# Patient Record
Sex: Female | Born: 1945
Health system: Southern US, Community
[De-identification: ages and names within clinical notes are randomized; demographics above are authoritative.]

## PROBLEM LIST (undated history)

## (undated) DIAGNOSIS — E119 Type 2 diabetes mellitus without complications: Secondary | ICD-10-CM

## (undated) DIAGNOSIS — J40 Bronchitis, not specified as acute or chronic: Secondary | ICD-10-CM

## (undated) DIAGNOSIS — I480 Paroxysmal atrial fibrillation: Secondary | ICD-10-CM

## (undated) DIAGNOSIS — E785 Hyperlipidemia, unspecified: Secondary | ICD-10-CM

## (undated) DIAGNOSIS — N189 Chronic kidney disease, unspecified: Secondary | ICD-10-CM

## (undated) DIAGNOSIS — M1990 Unspecified osteoarthritis, unspecified site: Secondary | ICD-10-CM

## (undated) DIAGNOSIS — K5900 Constipation, unspecified: Secondary | ICD-10-CM

## (undated) DIAGNOSIS — I10 Essential (primary) hypertension: Secondary | ICD-10-CM

## (undated) DIAGNOSIS — K219 Gastro-esophageal reflux disease without esophagitis: Secondary | ICD-10-CM

## (undated) DIAGNOSIS — D649 Anemia, unspecified: Secondary | ICD-10-CM

## (undated) HISTORY — PX: KNEE ARTHROSCOPY: SHX127

## (undated) HISTORY — DX: Chronic kidney disease, unspecified: N18.9

## (undated) HISTORY — DX: Type 2 diabetes mellitus without complications: E11.9

## (undated) HISTORY — DX: Anemia, unspecified: D64.9

## (undated) HISTORY — DX: Essential (primary) hypertension: I10

## (undated) HISTORY — PX: COLONOSCOPY: SHX174

## (undated) HISTORY — DX: Hyperlipidemia, unspecified: E78.5

## (undated) HISTORY — PX: TOTAL HIP ARTHROPLASTY: SHX124

## (undated) HISTORY — PX: LAPAROSCOPIC CHOLECYSTECTOMY: SUR755

---

## 2005-07-19 ENCOUNTER — Ambulatory Visit: Payer: Self-pay | Admitting: Cardiology

## 2006-03-05 ENCOUNTER — Encounter (INDEPENDENT_AMBULATORY_CARE_PROVIDER_SITE_OTHER): Payer: Self-pay | Admitting: *Deleted

## 2006-03-05 ENCOUNTER — Ambulatory Visit (HOSPITAL_BASED_OUTPATIENT_CLINIC_OR_DEPARTMENT_OTHER): Admission: RE | Admit: 2006-03-05 | Discharge: 2006-03-05 | Payer: Self-pay | Admitting: Orthopedic Surgery

## 2006-08-07 ENCOUNTER — Ambulatory Visit (HOSPITAL_COMMUNITY): Admission: RE | Admit: 2006-08-07 | Discharge: 2006-08-07 | Payer: Self-pay | Admitting: Family Medicine

## 2007-10-10 ENCOUNTER — Ambulatory Visit (HOSPITAL_COMMUNITY): Admission: RE | Admit: 2007-10-10 | Discharge: 2007-10-10 | Payer: Self-pay | Admitting: Family Medicine

## 2010-08-04 NOTE — Op Note (Signed)
NAME:  Kerri Brown, Kerri Brown             ACCOUNT NO.:  [id number]   MEDICAL RECORD NO.:  [id number]          PATIENT TYPE:  AMB   LOCATION:  [facility]                         FACILITY:  [facility]   PHYSICIAN:  Tarri Glenn, M.D.  DATE OF BIRTH:  06/06/1945   DATE OF PROCEDURE:  03/05/2006  DATE OF DISCHARGE:                               OPERATIVE REPORT   PREOPERATIVE DIAGNOSIS:  Degenerated/torn lateral meniscus with lateral  meniscus cyst, right knee.   POSTOPERATIVE DIAGNOSES:  1. Degenerated/torn lateral meniscus with lateral meniscus cyst, right      knee.  2. Torn medial meniscus.   OPERATION:  1. Right knee arthroscopy with partial medial and lateral      meniscectomies and debridement of medial femoral condyle and      patella.  2. Lateral arthrotomy, right knee with excision of lateral meniscus      cyst.   SURGEON:  Dr. Shellia Carwin   ASSISTANT:  Nurse.   ANESTHESIA:  General.   PATHOLOGY AND JUSTIFICATION FOR PROCEDURE:  She had had a right knee  arthroscopy elsewhere on October 17, 2005, and was found to have a  degenerative tear of the lateral meniscus.  There is also a degenerative  tear of the posterior horn of the medial meniscus; partial medial and  lateral meniscectomies were performed.  She has not done well since the  surgery with continued pain.  A followup MRI performed locally here on  February 23, 2006, has demonstrated degenerative arthritis of the lateral  joint and extensive intrameniscal cyst at the mid body of the anterior  horn of the lateral meniscus with secondary inflammatory changes.  Because of continued pain and the presence of the mass, she elected to  have further surgery on the right knee at this time.  See operative  description below for additional details.   PROCEDURE:  Satisfactory general anesthesia, pneumatic tourniquet.  Right leg was Esmarched out nonsterilely and thigh stabilizer applied  and the leg prepped from stabilizer to ankle with  DuraPrep and draped in  a sterile field.  Ace wrap and knee support for the left knee.  Superomedial saline inflow, first through an anterolateral portal of  medial compartment, knee joint was evaluated, and she had some grade 2-  3/4 wear of the medial femoral condyle which I pictured and smoothed  down.  She had a small recurrent tear of the posterior curve of the  medial meniscus which I pictured and shaved down until smooth with the  3.5 shaver.  Looking up in the medial gutter and suprapatellar area, she  had a little wear of the lateral facette of the patella which I pictured  and shaved.  I then reversed portals.  She had wear of the lateral  tibial plateau.  There was some disruption of the remaining lateral  meniscus in the midbody.  I shaved this down until smooth on probing.  There was no direct communication that I could see with the cyst  pressing the mass laterally, it did not change its size, and there was  no visualization of any problem in the joint.  Accordingly, I  temporarily halted the arthroscopic procedure and while the knee was in  a flexed position, made a lateral incision over the cyst, carrying it  down through the iliotibial band.  The cyst was quite inflamed and  thickened, and I excised it into the joint, as documented by  arthroscopic fluid coming forth.  The cyst measured about 2 cm in  diameter and was sent to pathology.  A picture was taken with the  arthroscopic equipment.  Bleeders were coagulated and this portion of  the wound closed with interrupted #1 Vicryl in the iliotibial band, 2-0  Vicryl in subcutaneous tissue, 4-0 nylon in the skin.  I then irrigated  the knee joint and removed all fluid possible, closing the 2 anterior  portals with 4-0 nylon.  I injected the lateral wound with 0.5% Marcaine  with adrenaline without morphine and then injected 20 mL of the Marcaine  with adrenaline with 4 mg morphine through the inflow apparatus which I   removed and closed this portal with 4-0 nylon as well.  Betadine,  Adaptic dry sterile dressing, knee immobilizer were applied.  She  tolerated the procedure well and was taken to the recovery room in  satisfactory condition with no known complications.           ______________________________  Tarri Glenn, M.D.     JA/MEDQ  D:  03/05/2006  T:  03/05/2006  Job:  [id number]

## 2010-08-05 ENCOUNTER — Inpatient Hospital Stay (HOSPITAL_COMMUNITY): Payer: Medicare Other

## 2010-08-05 ENCOUNTER — Inpatient Hospital Stay (HOSPITAL_COMMUNITY)
Admission: EM | Admit: 2010-08-05 | Discharge: 2010-08-08 | DRG: 470 | Disposition: A | Discharge status: Skilled Nursing Facility | Payer: Medicare Other | Source: Other Acute Inpatient Hospital | Attending: Orthopedic Surgery | Admitting: Orthopedic Surgery

## 2010-08-05 DIAGNOSIS — S72033A Displaced midcervical fracture of unspecified femur, initial encounter for closed fracture: Principal | ICD-10-CM | POA: Diagnosis present

## 2010-08-05 DIAGNOSIS — Y92009 Unspecified place in unspecified non-institutional (private) residence as the place of occurrence of the external cause: Secondary | ICD-10-CM

## 2010-08-05 DIAGNOSIS — M109 Gout, unspecified: Secondary | ICD-10-CM | POA: Diagnosis present

## 2010-08-05 DIAGNOSIS — E785 Hyperlipidemia, unspecified: Secondary | ICD-10-CM | POA: Diagnosis present

## 2010-08-05 DIAGNOSIS — W010XXA Fall on same level from slipping, tripping and stumbling without subsequent striking against object, initial encounter: Secondary | ICD-10-CM | POA: Diagnosis present

## 2010-08-05 DIAGNOSIS — Z7901 Long term (current) use of anticoagulants: Secondary | ICD-10-CM

## 2010-08-05 DIAGNOSIS — I10 Essential (primary) hypertension: Secondary | ICD-10-CM | POA: Diagnosis present

## 2010-08-05 DIAGNOSIS — F17200 Nicotine dependence, unspecified, uncomplicated: Secondary | ICD-10-CM | POA: Diagnosis present

## 2010-08-05 DIAGNOSIS — D62 Acute posthemorrhagic anemia: Secondary | ICD-10-CM | POA: Diagnosis not present

## 2010-08-05 DIAGNOSIS — E119 Type 2 diabetes mellitus without complications: Secondary | ICD-10-CM | POA: Diagnosis present

## 2010-08-05 LAB — BASIC METABOLIC PANEL
CO2: 26 mEq/L (ref 19–32)
Chloride: 103 mEq/L (ref 96–112)
GFR calc non Af Amer: 36 mL/min — ABNORMAL LOW (ref >60)
Glucose, Bld: 134 mg/dL — ABNORMAL HIGH (ref 70–99)
Potassium: 4.3 mEq/L (ref 3.5–5.1)
Sodium: 139 mEq/L (ref 135–145)

## 2010-08-05 LAB — MRSA PCR SCREENING: MRSA by PCR: NEGATIVE

## 2010-08-05 LAB — CBC
HCT: 37.3 % (ref 36.0–46.0)
Hemoglobin: 12.4 g/dL (ref 12.0–15.0)
RBC: 4.22 MIL/uL (ref 3.87–5.11)
WBC: 10.3 10*3/uL (ref 4.0–10.5)

## 2010-08-05 LAB — HEMOGLOBIN A1C
Hgb A1c MFr Bld: 6.1 % — ABNORMAL HIGH (ref <5.7)
Mean Plasma Glucose: 128 mg/dL — ABNORMAL HIGH (ref <117)

## 2010-08-05 LAB — URINALYSIS, ROUTINE W REFLEX MICROSCOPIC
Glucose, UA: NEGATIVE mg/dL
Leukocytes, UA: NEGATIVE
Protein, ur: 30 mg/dL — AB
Specific Gravity, Urine: 1.014 (ref 1.005–1.030)
Urobilinogen, UA: 0.2 mg/dL (ref 0.0–1.0)

## 2010-08-05 LAB — GLUCOSE, CAPILLARY: Glucose-Capillary: 136 mg/dL — ABNORMAL HIGH (ref 70–99)

## 2010-08-05 LAB — URINE MICROSCOPIC-ADD ON

## 2010-08-06 LAB — CBC
HCT: 26.5 % — ABNORMAL LOW (ref 36.0–46.0)
MCH: 30.9 pg (ref 26.0–34.0)
MCV: 88 fL (ref 78.0–100.0)
Platelets: 107 10*3/uL — ABNORMAL LOW (ref 150–400)
RBC: 3.01 MIL/uL — ABNORMAL LOW (ref 3.87–5.11)
RDW: 13.7 % (ref 11.5–15.5)
WBC: 10.7 10*3/uL — ABNORMAL HIGH (ref 4.0–10.5)

## 2010-08-06 LAB — URINE CULTURE
Colony Count: NO GROWTH
Culture: NO GROWTH

## 2010-08-06 LAB — GLUCOSE, CAPILLARY: Glucose-Capillary: 132 mg/dL — ABNORMAL HIGH (ref 70–99)

## 2010-08-06 LAB — BASIC METABOLIC PANEL
BUN: 16 mg/dL (ref 6–23)
Chloride: 103 mEq/L (ref 96–112)
Creatinine, Ser: 1.31 mg/dL — ABNORMAL HIGH (ref 0.4–1.2)
Glucose, Bld: 135 mg/dL — ABNORMAL HIGH (ref 70–99)
Potassium: 4.3 mEq/L (ref 3.5–5.1)

## 2010-08-06 LAB — PREPARE RBC (CROSSMATCH)

## 2010-08-07 LAB — BASIC METABOLIC PANEL
BUN: 13 mg/dL (ref 6–23)
CO2: 29 mEq/L (ref 19–32)
Chloride: 100 mEq/L (ref 96–112)
GFR calc non Af Amer: 40 mL/min — ABNORMAL LOW (ref >60)
Glucose, Bld: 97 mg/dL (ref 70–99)
Potassium: 3.9 mEq/L (ref 3.5–5.1)
Sodium: 135 mEq/L (ref 135–145)

## 2010-08-07 LAB — CBC
HCT: 24.7 % — ABNORMAL LOW (ref 36.0–46.0)
Hemoglobin: 8.3 g/dL — ABNORMAL LOW (ref 12.0–15.0)
MCH: 29.6 pg (ref 26.0–34.0)
MCHC: 33.6 g/dL (ref 30.0–36.0)
MCV: 88.2 fL (ref 78.0–100.0)
RDW: 13.5 % (ref 11.5–15.5)

## 2010-08-07 LAB — GLUCOSE, CAPILLARY: Glucose-Capillary: 123 mg/dL — ABNORMAL HIGH (ref 70–99)

## 2010-08-08 DIAGNOSIS — S72009A Fracture of unspecified part of neck of unspecified femur, initial encounter for closed fracture: Secondary | ICD-10-CM

## 2010-08-08 LAB — GLUCOSE, CAPILLARY: Glucose-Capillary: 139 mg/dL — ABNORMAL HIGH (ref 70–99)

## 2010-08-08 LAB — CBC
HCT: 24.3 % — ABNORMAL LOW (ref 36.0–46.0)
MCH: 29.9 pg (ref 26.0–34.0)
MCV: 87.4 fL (ref 78.0–100.0)
RDW: 13.4 % (ref 11.5–15.5)
WBC: 9.5 10*3/uL (ref 4.0–10.5)

## 2010-08-08 LAB — PROTIME-INR
INR: 1.41 (ref 0.00–1.49)
Prothrombin Time: 17.5 seconds — ABNORMAL HIGH (ref 11.6–15.2)

## 2010-08-09 LAB — TYPE AND SCREEN
Antibody Screen: NEGATIVE
Unit division: 0

## 2010-08-17 NOTE — Op Note (Signed)
NAME:  Kerri Brown, Kerri Brown             ACCOUNT NO.:  [id number]  MEDICAL RECORD NO.:  [id number]           PATIENT TYPE:  I  LOCATION:  5003                         FACILITY:  [facility]  PHYSICIAN:  Astrid Divine. Marcelino Scot, M.D. DATE OF BIRTH:  March 17, 1946  DATE OF PROCEDURE:  08/05/2010 DATE OF DISCHARGE:                              OPERATIVE REPORT   PREOPERATIVE DIAGNOSIS:  Displaced right femoral neck fracture.  POSTOPERATIVE DIAGNOSIS:  Displaced right femoral neck fracture.  PROCEDURE:  Right total hip arthroplasty with DePuy Gription cup and 2 screws 56 mm, #3 femoral stem, neutral neck and polyethylene liner.  SURGEON:  Astrid Divine. Marcelino Scot, MD  ASSISTANT:  Jari Pigg, PA  ANESTHESIA:  General.  COMPLICATIONS:  None.  ESTIMATED BLOOD LOSS:  260 mL.  DISPOSITION:  PACU.  CONDITION:  Stable.  BRIEF SUMMARY AND INDICATION FOR PROCEDURE:  Kerri Brown is a 65 year old female with antecedent right groin pain and arthritic symptoms, who fell and sustained a displaced femoral neck fracture.  I discussed with her preoperatively the risks and benefits of surgery including the possibility of infection, nerve injury, vessel injury, heart attack, stroke, leg length inequality, hip instability, and multiple others. She did wish to proceed.  Furthermore, we specifically discussed hemiarthroplasty versus total hip arthroplasty versus ORIF, explaining the risk of AVN, instability, blood loss; a subsequent surgery which could include conversion to total hip arthroplasty, revision arthroplasty, and multiple others; and she did wish to proceed with the recommended treatment of total hip arthroplasty.  BRIEF SUMMARY OF PROCEDURE:  Kerri Brown was administered preop antibiotics, taken to the operating room where general anesthesia was induced.  She was positioned right side up on a beanbag.  A standard prep and drape was then performed.  An anterolateral approach was made through a straight  incision centered over the proximal aspect of the greater trochanter.  The tensor was split in line with the incision. The anterior third of the medius was identified and divided. Electrocautery was used to release the musculature and hip capsule off the anterior aspect of the femur as the leg was gently rotated and the hip extended.  The femoral neck fracture was exposed and the cutting guide used to establish the appropriate plane.  This was followed with placement of Homan to protect the underlying bone and soft tissues.  The neck cut was made and then the acetabulum exposed by placing anterior and posterior retractors.  A long knife was used to debride the labrum and the articular cartilage followed by sequential reaming.  Reaming was begun in the medial inferior corner down to the fovea and then expanded out.  At 52 mm, there appeared to be some deep cancellus bone; however, the rim fit did not seem adequate.  Consequently reamed up additional size.  The 56-mm cup was then seated and initially appeared to have stability; however, in checking the hip, it appeared to become unstable and subsequent palpation did identify a 4-6-mm area along the posterior wall that no longer provided a good rim resistance.  Consequently, a decision was made to convert to a cup with screw augmentation.  A  40-mm cancellus screw was placed superiorly and a 25-mm screw posteriorly. This resulted in excellent cup stability and then the femur was prepared in standard fashion with sequential reaming and broaching up to 3 which gave an outstanding fit and control.  A standard neck and head produced outstanding stability in full extension and external rotation as well as flexion, abduction, internal rotation.  The hip appeared to extend with appropriate resistance and just very minimal shuck with complete pharmacologic paralysis.  The wound was copiously irrigated and then the actual implants placed.  I did also  use a C-arm to check position of the screws and confirm placement of the cup.  Standard layered closure was performed using bone tunnels for the anterior capsule and abductor attachments with a #1 Vicryl and then after the deep subcu, 0 Vicryl and 2-0 Vicryl for the subcu and staples for the skin.  Sterile gently compressive dressing was applied.  The patient was then awakened from anesthesia and transported to the PACU in stable condition.  Ainsley Spinner, PA-C assisted me throughout the procedure and was absolutely necessary for safe and effective completion of the case.  He was required to retract for acetabular exposure, control of the femur for preparation of the femoral component, and also assist with dislocation/relocation of the trial and actual components.  PROGNOSIS:  Kerri Brown will be touchdown or partial weightbearing up to 50% with PT.  She will have anterior hip precautions and will progress with PT and OT.  She will be on Coumadin for DVT prophylaxis as well as pharmacologic prophylaxis and bridging with Lovenox.     Astrid Divine. Marcelino Scot, M.D.     MHH/MEDQ  D:  08/05/2010  T:  08/06/2010  Job:  [id number]  Electronically Signed by Altamese El Refugio M.D. on 08/17/2010 12:00:18 AM

## 2010-08-17 NOTE — H&P (Signed)
Kerri Brown, Brown             ACCOUNT NO.:  [id number]  MEDICAL RECORD NO.:  [id number]           PATIENT TYPE:  LOCATION:                                 FACILITY:  PHYSICIAN:  Astrid Divine. Marcelino Scot, M.D. DATE OF BIRTH:  1945/10/13  DATE OF ADMISSION:  08/04/2010 DATE OF DISCHARGE:                             HISTORY & PHYSICAL   CHIEF COMPLAINT:  Fall with right hip pain.  PRIMARY CARE PHYSICIAN:  Dr. Matthias Hughs.  BRIEF HISTORY OF PRESENT ILLNESS:  Kerri Brown is a 65 year old Caucasian female who was playing with her new puppy yesterday afternoon when she got tripped up and fell.  The patient landed on her right hip, had immediate onset of pain and inability to bear weight.  She was brought to [facility] for evaluation and workup which demonstrated displaced right femoral neck fracture secondary to orthopedic surgery unavailability, the patient was transported to [facility] for definitive management.  The patient denies any chest pain, shortness of breath.  No nausea, vomiting, no recent illnesses. No lightheadedness, no visual changes, no headaches.  Currently, Kerri Brown is in room 5003, complains of right hip pain.  Denies injuries elsewhere.  Does not note any shortness of breath.  Again no chest pain, palpitations are noted at current time.  PAST MEDICAL HISTORY:  Hypertension, insulin-dependent diabetes mellitus, hyperlipidemia, and gout.  PAST SURGICAL HISTORY:  Right knee arthroscopy.  FAMILY HISTORY:  Noncontributory.  SOCIAL HISTORY:  The patient smokes 1/2 to 1-pack per day, has done so since 1975.  Denies alcohol use.  She lives in [place].  ALLERGIES:  No known drug allergies.  MEDICATIONS PRIOR TO ADMISSION:  Lopressor, Lantus, NovoLog, allopurinol, pravastatin.  REVIEW OF SYSTEMS:  Notable for right hip pain.  PHYSICAL EXAMINATION:  VITAL SIGNS:  Temperature 97.7, heart rate 78, respirations 18 at 98% on room air, BP is  137/77. GENERAL:  The patient is an old appearing female but in no acute distress. NECK:  Supple.  No lymphadenopathy.  No tenderness. LUNGS:  Decreased at bases but otherwise clear. CARDIAC:  S1 and S2 are noted. ABDOMEN:  Soft, nontender with positive bowel sounds.  The patient has a Foley in. EXTREMITIES:  Bilateral upper extremity and left lower extremity without any acute findings.  Motor and sensory function are intact.  Full active motion is noted.  Extremities are warm.  Right lower extremity:  The patient has tenderness to palpation over the right hip with any motion. No knee effusion or pain with palpation of her right knee is noted. Ankle motion is intact as well.  No deep calf tenderness noted. Compartments are soft and nontender.  Distal motor and sensory functions are intact along the deep peroneal nerve, superficial peroneal nerve, and tibial nerve.  EHL, FHL, anterior tibialis, posterior tibialis, peroneals, gastroc-soleus complex motor function are intact.  Quadriceps and hamstring motor function are intact as well.  Palpable dorsalis pedis pulses noted.  X-RAYS:  AP pelvis and lateral hip demonstrates a displaced transcervical femoral neck fracture with rotation of the femoral head, 100% displacement.  There is also preexisting degenerative changes to the hips  bilaterally.  EKG demonstrates right bundle-branch block as well as left anterior fascicular bundle-branch block.  Chest x-ray is notable for chronic changes likely related to COPD.  No acute processes are noted.  LABORATORY DATA:  Sodium 139, potassium 4.3, chloride 103, bicarb 26, BUN 21, creatinine 1.46, glucose 134, white blood cells 10.3, hemoglobin 12.4, hematocrit 37.3, platelets 150,000.  PLAN:  A 65 year old female status post fall with displaced right femoral neck fracture. 1. Displaced right femoral neck fracture, OTA classification 31-B3     [place] 4.  Given her preexisting hip arthritis as well  as her young     age, the patient is a candidate for total hip replacement.  We plan     on proceeding with this today.  The patient will likely be     weightbearing as tolerated after surgery.  I would anticipate a     short stay.  When stable, she would be discharged to home or short-     term nursing facility.  The patient will also have posterior     precautions as well. 2. Hypertension.  Resume home medications. 3. Insulin-dependent diabetes mellitus.  We will start sliding scale     insulin after surgery as well as resume her Lantus.  We will     continue close monitoring of her blood sugars while she is     inpatient to ensure optimal healing. 4. Hyperlipidemia and gout.  Continue home medications, DVT, PE     prophylaxis.  We will begin Lovenox after surgery and will continue     for 14 days afterwards, SCDs as well for mechanical prophylaxis.     EKG changes, the patient is asymptomatic at current time.  The     patient should follow up with her primary care physician regarding     any additional further workup from this.  ACTIVITY:  Bedrest for now.  We will obtain PT/OT consult postoperatively.  Continue to monitor the patient very closely as well.  DISPOSITION:  OR today for total hip arthroplasty with continued pain control and observation after surgery.     Jari Pigg, PA   ______________________________ Astrid Divine. Marcelino Scot, M.D.    KWP/MEDQ  D:  08/05/2010  T:  08/05/2010  Job:  [id number]  Electronically Signed by Ainsley Spinner PA on 08/07/2010 01:04:54 PM Electronically Signed by Altamese Rossmoyne M.D. on 08/17/2010 12:00:15 AM

## 2010-08-17 NOTE — Discharge Summary (Signed)
NAME:  Kerri Brown, Kerri Brown             ACCOUNT NO.:  [id number]  MEDICAL RECORD NO.:  [id number]           PATIENT TYPE:  I  LOCATION:  5003                         FACILITY:  [facility]  PHYSICIAN:  Astrid Divine. Marcelino Scot, M.D. DATE OF BIRTH:  11-11-1945  DATE OF ADMISSION:  08/05/2010 DATE OF DISCHARGE:  08/08/2010                        DISCHARGE SUMMARY - REFERRING   DISCHARGE DIAGNOSES: 1. Fall. 2. Displaced right femoral neck fracture. 3. Acute blood loss anemia, stable and asymptomatic.  ADDITIONAL DISCHARGE DIAGNOSES: 1. Hypertension. 2. Insulin-dependent diabetes mellitus. 3. Hyperlipidemia. 4. Gout.  PROCEDURES PERFORMED:  On Aug 05, 2010, right total hip arthroplasty.  BRIEF HISTORY AND HOSPITAL COURSE:  Kerri Brown is a 65 year old Caucasian female who was playing with her new puppy on Aug 03, 2010, when she got tripped up.  She fell and had immediate onset of pain, inability to bear weight.  She was subsequently brought to [facility] for evaluation and treatment of her fracture.  However, they were unable to treat her and she was then transported to [facility] Brown to our service.  The patient was seen and evaluated shortly after arrival.  Based on her findings and clinical exam as well as past history of groin pain on the right side, we decided to proceed with a total hip arthroplasty for this patient.  Kerri Brown hospital stay was relatively uncomplicated.  Postoperative day #1, she was doing very well.  She did complain of some right hip pain but overall was doing fantastic.  Physical exam was unremarkable.  Labs showed a hemoglobin of 9.3  and hematocrit of 26.5.  She began to progress very well with physical therapy as well and did not have any have significant issues. On postop day #2, the patient continued to be very well, tolerating her diet.  Again her physical exam and vital signs and labs all remained stable.  She was started on Lovenox and Coumadin  for DVT prophylaxis. Her H and H did take another drop to 8.3 and 24.7; however, she was quite asymptomatic as well.  The patient was also continued on sliding scale insulin for additional coverage for her diabetes which do appear to be fairly well controlled as she had a hemoglobin A1c 6.1.  On postoperative day #3, the patient was deemed stable both from orthopedic standpoint as well as physical therapy standpoint to be discharged to a skilled nursing facility.  Clinical encounter note for postoperative day #3 is as follows, subjective/objective, the patient is doing great.  Pain is well controlled.  PHYSICAL EXAMINATION:  VITAL SIGNS:  Temperature 98.2, heart rate 72, respirations 20 at 100% on room air, BP is 110/53.  INR 1.41 and follow- up labs, CBC, hemoglobin 8.3, hematocrit 24.3, platelets 143, white blood cells 9.5. GENERAL:  The patient in no acute distress.  She is eating breakfast. LUNGS:  Clear bilaterally. CARDIAC:  S1 and S2 noted. ABDOMEN:  Soft, nontender with positive bowel sounds. EXTREMITIES:  Right lower extremity incision looks fantastic, edges are everted nicely.  No signs of infection.  No redness, no purulent drainage.  EHL, FHL, anterior tibialis, posterior tibialis, peroneals, gastroc-soleus complex, motor function  intact.  Extremities are warm. Palpable dorsalis pedis pulses noted.  No pain with passive stretch.  No new calf tenderness noted as well.  ASSESSMENT AND PLAN:  A 65 year old female, status post fall with right femoral neck fracture. 1. Right femoral neck fracture, status post right total hip     arthroplasty.  The patient will be partial weightbearing 50% on her     right leg.  She will have anterior hip precautions.  She will     continue to work with PT and OT on a daily basis. 2. Acute blood loss anemia, asymptomatic. 3. Insulin-dependent diabetes mellitus, excellent control.  Continue     current medication regimen. 4. Hypertension,  controlled.  Continue home meds. 5. Hyperlipidemia and gout.  Continue home meds. 6. Pain.  Continue oral pain medications which include Percocet as     well as breakthrough OxyIR. 7. DVT and PE prophylaxis with Lovenox and Coumadin.  Continue lower     coverage until INR is therapeutic which is between 2 and 3, then     can discontinue Lovenox.  Continue Coumadin for 4 weeks.  DISPOSITION:  We will discharge to a skilled nursing facility today.  DISCHARGE MEDICATIONS: 1. Colace 100 mg 1 p.o. b.i.d. 2. Lovenox 40 mg 1 subcu injection daily, discontinue when INR is     therapeutic, on Coumadin with a level between 2 and 3. 3. Insulin glargine 24 units subcu at bedtime daily. 4. Robaxin 500 mg 1 p.o. every 6 hours as needed. 5. Oxycodone 5 mg immediate release 1-2 p.o. every 3 hours as needed     for breakthrough pain. 6. Percocet 5/325 one to two p.o. every 6 hours as needed for pain. 7. Maalox 17 g by mouth daily. 8. Coumadin per Pharmacy protocol daily. 9. Allopurinol 190 mg one p.o. daily. 10.Glipizide 5 mg one p.o. daily. 11.Metoprolol 25 mg one p.o. b.i.d. 12.NovoLog per sliding scale. 13.Pravastatin 40 mg one p.o. daily.  DISCHARGE INSTRUCTIONS AND PLAN:  Kerri Brown did sustain a fairly significant injury to her right hip.  However, we were able to achieve excellent fixation utilizing total hip arthroplasty.  Given her pre- existing hip arthritis, this is correct decision and will likely afford the patient's best opportunity to full recovery.  Kerri Brown will remain partial weightbearing with 50% of her body weight on her right leg for the next 6 weeks.  She will also have anterior hip precautions for life as well.  With respect to wound care, her incision should be checked daily, dry dressing can be applied to it if still draining, otherwise may be left open to air.  Once it is completely dry, can begin to clean with soap and water.  At no point, should any  lotions, ointments, or solutions be applied to the wound as these can cause dehiscence.  Kerri Brown will also remain on DVT prophylaxis for the next 4-6 weeks as well.  We will use Lovenox bridged to Coumadin to accomplish this.  The patient should participate with physical therapy, occupational therapy on a daily basis as well and can be discharged home when she is deemed safe enough to do so . The patient is discharged on a regular diet as well.  She will follow up with the orthopedic office in 10-14 days for reevaluation, follow-up x- rays, and staple removal.  Should the nursing home have any questions prior to her follow-up visit, they are encouraged to contact the office at [phone].  Jari Pigg, PA   ______________________________ Astrid Divine. Marcelino Scot, M.D.    KWP/MEDQ  D:  08/08/2010  T:  08/08/2010  Job:  [id number]  Electronically Signed by Ainsley Spinner PA on 08/16/2010 02:46:27 PM Electronically Signed by Altamese Midway M.D. on 08/17/2010 12:00:23 AM

## 2015-03-30 ENCOUNTER — Encounter: Payer: Self-pay | Admitting: Cardiovascular Disease

## 2015-04-12 ENCOUNTER — Other Ambulatory Visit: Payer: Self-pay | Admitting: *Deleted

## 2015-04-12 ENCOUNTER — Ambulatory Visit (INDEPENDENT_AMBULATORY_CARE_PROVIDER_SITE_OTHER): Payer: Medicare Other | Admitting: Cardiovascular Disease

## 2015-04-12 ENCOUNTER — Encounter: Payer: Self-pay | Admitting: *Deleted

## 2015-04-12 VITALS — BP 128/77 | HR 69 | Ht 67.0 in | Wt 150.0 lb

## 2015-04-12 DIAGNOSIS — R002 Palpitations: Secondary | ICD-10-CM | POA: Diagnosis not present

## 2015-04-12 DIAGNOSIS — E785 Hyperlipidemia, unspecified: Secondary | ICD-10-CM

## 2015-04-12 DIAGNOSIS — I10 Essential (primary) hypertension: Secondary | ICD-10-CM

## 2015-04-12 NOTE — Progress Notes (Signed)
Patient ID: Kerri Brown, female   DOB: Jan 12, 1946, 70 y.o.   MRN: [id number]       CARDIOLOGY CONSULT NOTE  Patient ID: Kerri Brown MRN: [id number] DOB/AGE: [birth year] 70 y.o.  Admit date: (Not on file) Primary Physician Deloria Lair, MD  Requesting physician: Lowanda Foster  Reason for Consultation: arrhythmia  HPI:  The patient is a 70 year old female with stage IV chronic kidney disease, insulin-dependent diabetes mellitus, hypertension , gout, and hyperlipidemia  who is referred for arrhythmia.  ECG performed in the office today demonstrates sinus rhythm, heart rate 67 bpm, with right bundle-branch block and left anterior fascicular block.  Approximately one year ago, she was experiencing rapid palpitations occasionally waking her from sleep, sometimes as frequently as twice per week. She was then started on metoprolol 25 mg twice daily and she now experiences palpitations roughly once a month. She used to have chest pains but this was seldom. She denies exertional dyspnea. She also denies leg swelling, orthopnea, and paroxysmal nocturnal dyspnea. She said she feels very well now.  She did say that her nephrologist appreciated and arrhythmia at a recent visit and was warned she may have a stroke.    No Known Allergies  Current Outpatient Prescriptions  Medication Sig Dispense Refill  . allopurinol (ZYLOPRIM) 100 MG tablet Take 1 tablet by mouth daily.    Marland Kitchen aspirin EC 81 MG tablet Take 81 mg by mouth 2 (two) times a week.    . benazepril (LOTENSIN) 20 MG tablet Take 1 tablet by mouth daily.    . cholecalciferol (VITAMIN D) 1000 units tablet Take 1,000 Units by mouth daily.    Marland Kitchen LANTUS SOLOSTAR 100 UNIT/ML Solostar Pen Inject 42 Units into the skin daily.    . metoprolol tartrate (LOPRESSOR) 25 MG tablet Take 1 tablet by mouth 2 (two) times daily.    . pravastatin (PRAVACHOL) 40 MG tablet Take 1 tablet by mouth daily.     No current facility-administered medications for  this visit.    Past Medical History  Diagnosis Date  . Diabetes ([place])     stage 4  . CKD (chronic kidney disease)   . Hypertension   . Anemia   . Hyperlipemia     Past Surgical History  Procedure Laterality Date  . Laparoscopic cholecystectomy    . Total hip arthroplasty Right   . Knee arthroscopy Right     Social History   Social History  . Marital Status: Widowed    Spouse Name: N/A  . Number of Children: N/A  . Years of Education: N/A   Occupational History  . Not on file.   Social History Main Topics  . Smoking status: Not on file  . Smokeless tobacco: Not on file  . Alcohol Use: Not on file  . Drug Use: Not on file  . Sexual Activity: Not on file   Other Topics Concern  . Not on file   Social History Narrative  . No narrative on file     No family history of premature CAD in 1st degree relatives.  Prior to Admission medications   Medication Sig Start Date End Date Taking? Authorizing Provider  allopurinol (ZYLOPRIM) 100 MG tablet Take 1 tablet by mouth daily. 03/01/15   Historical Provider, MD  benazepril (LOTENSIN) 20 MG tablet Take 1 tablet by mouth daily. 02/02/15   Historical Provider, MD  cholecalciferol (VITAMIN D) 1000 units tablet Take 1,000 Units by mouth daily.    Historical Provider, MD  iron polysaccharides (NIFEREX) 150 MG capsule Take 150 mg by mouth daily.    Historical Provider, MD  LANTUS SOLOSTAR 100 UNIT/ML Solostar Pen Inject 42 Units into the skin daily. 04/05/15   Historical Provider, MD  metoprolol tartrate (LOPRESSOR) 25 MG tablet Take 1 tablet by mouth 2 (two) times daily. 04/05/15   Historical Provider, MD  pravastatin (PRAVACHOL) 40 MG tablet Take 1 tablet by mouth daily. 03/01/15   Historical Provider, MD     Review of systems complete and found to be negative unless listed above in HPI     Physical exam Blood pressure 128/77, pulse 69, height 5\' 7"  (1.702 m), weight 150 lb (68.04 kg). General: NAD Neck: No JVD, no  thyromegaly or thyroid nodule.  Lungs: Clear to auscultation bilaterally with normal respiratory effort. CV: Nondisplaced PMI. Regular rate and mostly regular rhythm with some variation, normal S1/S2, no [id number], soft 1/6 systolic murmur along left sternal border.  No peripheral edema.  Abdomen: Soft, nontender, no hepatosplenomegaly, no distention.  Skin: Intact without lesions or rashes.  Neurologic: Alert and oriented x 3.  Psych: Normal affect. Extremities: No clubbing or cyanosis.  HEENT: Normal.   ECG: Most recent ECG reviewed.  Labs:   Lab Results  Component Value Date   WBC 9.5 08/08/2010   HGB 8.3* 08/08/2010   HCT 24.3* 08/08/2010   MCV 87.4 08/08/2010   PLT 143* 08/08/2010   No results for input(s): NA, K, CL, CO2, BUN, CREATININE, CALCIUM, PROT, BILITOT, ALKPHOS, ALT, AST, GLUCOSE in the last 168 hours.  Invalid input(s): LABALBU No results found for: CKTOTAL, CKMB, CKMBINDEX, TROPONINI No results found for: CHOL No results found for: HDL No results found for: LDLCALC No results found for: TRIG No results found for: CHOLHDL No results found for: LDLDIRECT       Studies: No results found.  ASSESSMENT AND PLAN:  1. Palpitations: Given her CKD and hypertension, there is some concern for atrial fibrillation which would require anticoagulation. I will have her hold her metoprolol for three weeks and have her wear an event monitor to see if we can capture any worrisome arrhythmias. Afterwards, she can resume metoprolol.  2. Essential HTN: Controlled on benazepril. No changes.  3. Hyperlipidemia: Continue pravastatin.  Dispo: f/u 2 months.   Signed: Kate Sable, M.D., F.A.C.C.  04/12/2015, 10:18 AM

## 2015-04-12 NOTE — Patient Instructions (Signed)
Your physician has recommended that you wear a 3 week event monitor. Event monitors are medical devices that record the heart's electrical activity. Doctors most often [place] these monitors to diagnose arrhythmias. Arrhythmias are problems with the speed or rhythm of the heartbeat. The monitor is a small, portable device. You can wear one while you do your normal daily activities. This is usually used to diagnose what is causing palpitations/syncope (passing out). Please hold your Metoprolol during the time you are wearing the monitor.  Office will contact with results via phone or letter.   Continue all current medications. Follow up in  2 months.

## 2015-04-15 ENCOUNTER — Ambulatory Visit (INDEPENDENT_AMBULATORY_CARE_PROVIDER_SITE_OTHER): Payer: Medicare Other

## 2015-04-15 DIAGNOSIS — R002 Palpitations: Secondary | ICD-10-CM | POA: Diagnosis not present

## 2015-05-16 ENCOUNTER — Telehealth: Payer: Self-pay | Admitting: *Deleted

## 2015-05-16 DIAGNOSIS — I4891 Unspecified atrial fibrillation: Secondary | ICD-10-CM

## 2015-05-19 MED ORDER — RIVAROXABAN 20 MG PO TABS: 20.0000 mg | ORAL_TABLET | Freq: Every day | ORAL | Status: DC

## 2015-05-19 NOTE — Telephone Encounter (Signed)
Notes Recorded by Laurine Blazer, LPN on [id number] at 075-GRM AM Patient notified. Agrees to do Echo & begin Xarelto. Will provide samples & printed scripts for patient to pick up today. Follow up scheduled for 06/10/2015 with Dr. Bronson Ing.

## 2015-05-19 NOTE — Telephone Encounter (Signed)
Notes Recorded by Laurine Blazer, LPN on [id number] at D34-534 AM Left message to return call.  Notes Recorded by Laurine Blazer, LPN on [id number] at 075-GRM PM Busy.   Notes Recorded by Herminio Commons, MD on 05/12/2015 at 4:11 PM Needs echocardiogram and anticoagulation. Would start Xarelto 20 mg daily.

## 2015-05-26 ENCOUNTER — Other Ambulatory Visit: Payer: Self-pay

## 2015-05-26 ENCOUNTER — Ambulatory Visit (INDEPENDENT_AMBULATORY_CARE_PROVIDER_SITE_OTHER): Payer: Medicare Other

## 2015-05-26 DIAGNOSIS — I4891 Unspecified atrial fibrillation: Secondary | ICD-10-CM

## 2015-05-26 LAB — ECHOCARDIOGRAM COMPLETE
CHL CUP STROKE VOLUME: 64 mL
E decel time: 351 msec
FS: 35 % (ref 28–44)
IVS/LV PW RATIO, ED: 0.85
LA diam end sys: 34 cm
LDCA: 2.01 cm2
LV PW d: 12.3 mm — AB (ref 0.6–1.1)
LVOT peak grad rest: 9 mmHg
LVOTPV: 146 m/s
MV Peak grad: 2 mmHg
MVPKAVEL: 71 m/s
MVPKEVEL: 75.9 m/s
VTI: 31.7 cm

## 2015-06-02 ENCOUNTER — Telehealth: Payer: Self-pay | Admitting: *Deleted

## 2015-06-02 NOTE — Telephone Encounter (Signed)
Patient advised that her echo has not been reviewed by the doctor yet but that she did have a normal pumping function. Patient advised that once the doctor results on her echo, that she would be contacted with the official result.

## 2015-06-10 ENCOUNTER — Encounter: Payer: Self-pay | Admitting: Cardiovascular Disease

## 2015-06-10 ENCOUNTER — Ambulatory Visit (INDEPENDENT_AMBULATORY_CARE_PROVIDER_SITE_OTHER): Payer: Medicare Other | Admitting: Cardiovascular Disease

## 2015-06-10 VITALS — BP 120/72 | HR 60 | Ht 67.0 in | Wt 153.0 lb

## 2015-06-10 DIAGNOSIS — Z7189 Other specified counseling: Secondary | ICD-10-CM

## 2015-06-10 DIAGNOSIS — I480 Paroxysmal atrial fibrillation: Secondary | ICD-10-CM | POA: Diagnosis not present

## 2015-06-10 DIAGNOSIS — I10 Essential (primary) hypertension: Secondary | ICD-10-CM

## 2015-06-10 DIAGNOSIS — E785 Hyperlipidemia, unspecified: Secondary | ICD-10-CM

## 2015-06-10 NOTE — Progress Notes (Signed)
Patient ID: Kerri Brown, female   DOB: 1945/04/16, 70 y.o.   MRN: [id number]      SUBJECTIVE: The patient returns for follow-up after undergoing cardiovascular testing performed for the evaluation of palpitations. Event monitoring demonstrated paroxysmal rapid atrial fibrillation which was asymptomatic. Echocardiography demonstrated normal left ventricular systolic function, EF 123456, mild LVH, grade 2 diastolic dysfunction, and mild left atrial enlargement. I started her on Xarelto 20 mg daily. She is already on metoprolol. She seldom has palpitations. She denies bleeding problems.   Review of Systems: As per "subjective", otherwise negative.  No Known Allergies  Current Outpatient Prescriptions  Medication Sig Dispense Refill  . allopurinol (ZYLOPRIM) 100 MG tablet Take 1 tablet by mouth daily.    . benazepril (LOTENSIN) 20 MG tablet Take 1 tablet by mouth daily.    . cholecalciferol (VITAMIN D) 1000 units tablet Take 1,000 Units by mouth daily.    Marland Kitchen LANTUS SOLOSTAR 100 UNIT/ML Solostar Pen Inject 42 Units into the skin daily.    . metoprolol tartrate (LOPRESSOR) 25 MG tablet Take 1 tablet by mouth 2 (two) times daily.    . pravastatin (PRAVACHOL) 40 MG tablet Take 1 tablet by mouth daily.    . rivaroxaban (XARELTO) 20 MG TABS tablet Take 1 tablet (20 mg total) by mouth daily with supper. 30 tablet 6   No current facility-administered medications for this visit.    Past Medical History  Diagnosis Date  . Diabetes ([place])     stage 4  . CKD (chronic kidney disease)   . Hypertension   . Anemia   . Hyperlipemia     Past Surgical History  Procedure Laterality Date  . Laparoscopic cholecystectomy    . Total hip arthroplasty Right   . Knee arthroscopy Right     Social History   Social History  . Marital Status: Widowed    Spouse Name: N/A  . Number of Children: N/A  . Years of Education: N/A   Occupational History  . Not on file.   Social History Main Topics  .  Smoking status: Former Smoker -- 0.50 packs/day for 44 years    Types: Cigarettes    Start date: 05/04/1970    Quit date: 05/04/2014  . Smokeless tobacco: Never Used  . Alcohol Use: Not on file  . Drug Use: Not on file  . Sexual Activity: Not on file   Other Topics Concern  . Not on file   Social History Narrative     Filed Vitals:   06/10/15 1032  BP: 120/72  Pulse: 60  Height: 5\' 7"  (1.702 m)  Weight: 153 lb (69.4 kg)    PHYSICAL EXAM General: NAD Neck: No JVD, no thyromegaly or thyroid nodule.  Lungs: Clear to auscultation bilaterally with normal respiratory effort. CV: Nondisplaced PMI. Regular rate and rhythm, normal S1/S2, no [id number], soft 1/6 systolic murmur along left sternal border. No peripheral edema.  Abdomen: Soft, nontender, no hepatosplenomegaly, no distention.  Skin: Intact without lesions or rashes.  Neurologic: Alert and oriented x 3.  Psych: Normal affect. Extremities: No clubbing or cyanosis.  HEENT: Normal.   ECG: Most recent ECG reviewed.      ASSESSMENT AND PLAN: 1. Paroxsymal atrial fibrillation: Symptomatically stable on metoprolol. Continue Xarelto for thromboembolic risk reduction given CHA2DS2Vasc score of 4.  2. Essential HTN: Controlled on benazepril. No changes.  3. Hyperlipidemia: Continue pravastatin.  Dispo: f/u 6 months.   Kate Sable, M.D., F.A.C.C.

## 2015-06-10 NOTE — Patient Instructions (Signed)
Continue all current medications. Your physician wants you to follow up in: 6 months.  You will receive a reminder letter in the mail one-two months in advance.  If you don't receive a letter, please call our office to schedule the follow up appointment   

## 2015-06-21 ENCOUNTER — Ambulatory Visit (INDEPENDENT_AMBULATORY_CARE_PROVIDER_SITE_OTHER): Payer: Medicare Other | Admitting: *Deleted

## 2015-06-21 DIAGNOSIS — Z5181 Encounter for therapeutic drug level monitoring: Secondary | ICD-10-CM

## 2015-06-21 DIAGNOSIS — I4891 Unspecified atrial fibrillation: Secondary | ICD-10-CM | POA: Diagnosis not present

## 2015-06-21 MED ORDER — RIVAROXABAN 15 MG PO TABS: 15.0000 mg | ORAL_TABLET | Freq: Every day | ORAL | Status: DC

## 2015-06-21 NOTE — Progress Notes (Signed)
Pt was started on Xarelto 20mg  daily for atrial fibrillation on 05/19/15 by Dr Bronson Ing.  Labs: 03/01/15  SrCr 1.86  CrCl 30.66  Wt 152  Hgb 12.7  Hct 39.7  Pt denies any problems taking Xarelto.  Denies excessive bruising, bleeding or GI Upset.  Reviewed patients medication list.  Pt is not currently on any combined P-gp and strong CYP3A4 inhibitors/inducers (ketoconazole, traconazole, ritonavir, carbamazepine, phenytoin, rifampin, St. John's wort).  Reviewed labs on 06/08/15 from Dr Rhona Leavens office.  SCr 1.92, Weight 153, CrCl 29.87.  Dose needs to be decreased to 15mg  daily based on CrCl.   Hgb and HCT: 12.4/39.1  A full discussion of the nature of anticoagulants has been carried out.  A benefit/risk analysis has been presented to the patient, so that they understand the justification for choosing anticoagulation with Xarelto at this time.  The need for compliance is stressed.  Pt is aware to take the medication once daily with the largest meal of the day.  Side effects of potential bleeding are discussed, including unusual colored urine or stools, coughing up blood or coffee ground emesis, nose bleeds or serious fall or head trauma.  Discussed signs and symptoms of stroke. The patient should avoid any OTC items containing aspirin or ibuprofen.  Avoid alcohol consumption.   Call if any signs of abnormal bleeding.  Discussed financial obligations and resolved any difficulty in obtaining medication.    Discussed dose change with Dr Bronson Ing and he agreed.  New Rx sent to [facility].  Next lab test test in 3 months.  Appt made for 09/27/15

## 2015-08-12 ENCOUNTER — Other Ambulatory Visit: Payer: Self-pay | Admitting: *Deleted

## 2015-08-12 MED ORDER — RIVAROXABAN 15 MG PO TABS: 15.0000 mg | ORAL_TABLET | Freq: Every day | ORAL | Status: DC

## 2015-08-12 NOTE — Telephone Encounter (Signed)
Patient's insurance requires prior authorization for xarelto 15 mg. Patient said she will be out on Monday. Samples provided for patient pick up at the [place] office. Patient aware.

## 2015-08-17 ENCOUNTER — Telehealth: Payer: Self-pay | Admitting: *Deleted

## 2015-08-17 NOTE — Telephone Encounter (Signed)
Xarelto 15mg  - approved through 03/18/2016 - [employer] [facility].

## 2015-09-27 ENCOUNTER — Ambulatory Visit (INDEPENDENT_AMBULATORY_CARE_PROVIDER_SITE_OTHER): Payer: Medicare Other | Admitting: *Deleted

## 2015-09-27 DIAGNOSIS — I480 Paroxysmal atrial fibrillation: Secondary | ICD-10-CM

## 2015-09-27 NOTE — Progress Notes (Signed)
Pt was started on Xarelto 20 mg daily  for Atrial Fib on March 3,2017 .and the Xarelto was reduced to 15mg  daily due to CrCl in April 2017     Reviewed patients medication list.  Pt is not  currently on any combined P-gp and strong CYP3A4 inhibitors/inducers (ketoconazole, traconazole, ritonavir, carbamazepine, phenytoin, rifampin, St. John's wort).  Reviewed labs.  SCr 2.18 , Weight 71.45 kg , CrCl- .  Dose is appropriate based on CrCl 27.08 .   Hgb 12.1 and HCT 37.2   A full discussion of the nature of anticoagulants has been carried out.  A benefit/risk analysis has been presented to the patient, so that they understand the justification for choosing anticoagulation with Xarelto  The need for compliance is stressed.  Pt is aware to take the medication once daily with the largest meal of the day.  Side effects of potential bleeding are discussed, including unusual colored urine or stools, coughing up blood or coffee ground emesis, nose bleeds or serious fall or head trauma.  Discussed signs and symptoms of stroke. The patient should avoid any OTC items containing aspirin or ibuprofen.  Avoid alcohol consumption.   Call if any signs of abnormal bleeding.  Discussed financial obligations and states is not having  any difficulty in obtaining medication.  Next lab test test in 3  months. Pt states she has not missed any doses and has had no sign or symptom of bleeding. Denies any problems in taking the Xarelto Will do CBC and BMET today and will call with results  Lab called with critical Potassium 6.5 sent results to Dr Harl Bowie who instructed she is to go to ER Have tried multiple times to call her with no answer to phone left message on her machine 3PM spoke with pt and instructed her that Dr Harl Bowie instructed for her to go to ER due to elevated Potassium and she states will do so  09/28/2015 Spoke with pt and she states she did go to ER yesterday and that they did see regarding elevated potassium and she  states they said Potassium was normal when she left Pt instructed that she is to continue on Xarelto 15mg  daily and appt made for her to recheck in coumadin clinic in [place] in 3 months and she states understanding

## 2015-09-29 ENCOUNTER — Telehealth: Payer: Self-pay | Admitting: *Deleted

## 2015-09-29 ENCOUNTER — Encounter: Payer: Self-pay | Admitting: *Deleted

## 2015-09-29 NOTE — Telephone Encounter (Signed)
Pt says she feels fine today, says [facility] didn't admit her and she was back home that night. Will request d/c summary.

## 2015-09-29 NOTE — Telephone Encounter (Signed)
-----   Message from Arnoldo Lenis, MD sent at 09/29/2015  4:13 PM EDT ----- Patient already notified about lab results, instructed to go to ER. Can we follow up with her and see how she is doing?  Zandra Abts MD

## 2015-12-07 ENCOUNTER — Encounter: Payer: Self-pay | Admitting: Cardiology

## 2015-12-12 ENCOUNTER — Other Ambulatory Visit: Payer: Self-pay | Admitting: *Deleted

## 2015-12-12 MED ORDER — RIVAROXABAN 15 MG PO TABS: 15.0000 mg | ORAL_TABLET | Freq: Every day | ORAL | 6 refills | Status: DC

## 2015-12-27 ENCOUNTER — Ambulatory Visit (INDEPENDENT_AMBULATORY_CARE_PROVIDER_SITE_OTHER): Payer: Medicare Other | Admitting: *Deleted

## 2015-12-27 DIAGNOSIS — I480 Paroxysmal atrial fibrillation: Secondary | ICD-10-CM | POA: Diagnosis not present

## 2015-12-27 NOTE — Progress Notes (Addendum)
Pt was started on Xarelto 20 mg daily  for Atrial Fib on March 3,2017 .and the Xarelto was reduced to 15mg  daily due to CrCl in April 2017     Reviewed patients medication list.  Pt is not  currently on any combined P-gp and strong CYP3A4 inhibitors/inducers (ketoconazole, traconazole, ritonavir, carbamazepine, phenytoin, rifampin, St. John's wort).  Reviewed labs.  SCr 1.91, Weight 162lb , CrCl 31.79.  Dose is appropriate based on CrCl  .   Hgb/Hct: 11.9/36.6   A full discussion of the nature of anticoagulants has been carried out.  A benefit/risk analysis has been presented to the patient, so that they understand the justification for choosing anticoagulation with Xarelto  The need for compliance is stressed.  Pt is aware to take the medication once daily with the largest meal of the day.  Side effects of potential bleeding are discussed, including unusual colored urine or stools, coughing up blood or coffee ground emesis, nose bleeds or serious fall or head trauma.  Discussed signs and symptoms of stroke. The patient should avoid any OTC items containing aspirin or ibuprofen.  Avoid alcohol consumption.   Call if any signs of abnormal bleeding.  Discussed financial obligations and states is not having  any difficulty in obtaining medication.  Next lab test test in 4 months. 04/2016.  Appt made. Pt states she has not missed any doses and has had no sign or symptom of bleeding. Denies any problems in taking the Xarelto.  Requested CBC and BMP done 9/20 at [facility].

## 2016-03-30 ENCOUNTER — Encounter: Payer: Self-pay | Admitting: *Deleted

## 2016-04-02 ENCOUNTER — Ambulatory Visit: Payer: Medicare Other | Admitting: Cardiovascular Disease

## 2016-04-02 ENCOUNTER — Encounter: Payer: Self-pay | Admitting: *Deleted

## 2016-04-11 DIAGNOSIS — I129 Hypertensive chronic kidney disease with stage 1 through stage 4 chronic kidney disease, or unspecified chronic kidney disease: Secondary | ICD-10-CM | POA: Diagnosis not present

## 2016-04-11 DIAGNOSIS — E1122 Type 2 diabetes mellitus with diabetic chronic kidney disease: Secondary | ICD-10-CM | POA: Diagnosis not present

## 2016-04-11 DIAGNOSIS — E559 Vitamin D deficiency, unspecified: Secondary | ICD-10-CM | POA: Diagnosis not present

## 2016-04-11 DIAGNOSIS — R809 Proteinuria, unspecified: Secondary | ICD-10-CM | POA: Diagnosis not present

## 2016-04-11 DIAGNOSIS — D509 Iron deficiency anemia, unspecified: Secondary | ICD-10-CM | POA: Diagnosis not present

## 2016-04-11 DIAGNOSIS — Z79899 Other long term (current) drug therapy: Secondary | ICD-10-CM | POA: Diagnosis not present

## 2016-04-11 DIAGNOSIS — N184 Chronic kidney disease, stage 4 (severe): Secondary | ICD-10-CM | POA: Diagnosis not present

## 2016-04-13 ENCOUNTER — Ambulatory Visit (INDEPENDENT_AMBULATORY_CARE_PROVIDER_SITE_OTHER): Payer: Medicare Other | Admitting: Cardiovascular Disease

## 2016-04-13 ENCOUNTER — Encounter: Payer: Self-pay | Admitting: Cardiovascular Disease

## 2016-04-13 VITALS — BP 168/74 | HR 64 | Ht 67.0 in | Wt 161.0 lb

## 2016-04-13 DIAGNOSIS — I10 Essential (primary) hypertension: Secondary | ICD-10-CM

## 2016-04-13 DIAGNOSIS — E7800 Pure hypercholesterolemia, unspecified: Secondary | ICD-10-CM | POA: Diagnosis not present

## 2016-04-13 DIAGNOSIS — I480 Paroxysmal atrial fibrillation: Secondary | ICD-10-CM

## 2016-04-13 NOTE — Patient Instructions (Signed)
Your physician wants you to follow-up in: 1 year with Dr Koneswaran You will receive a reminder letter in the mail two months in advance. If you don't receive a letter, please call our office to schedule the follow-up appointment.   Your physician recommends that you continue on your current medications as directed. Please refer to the Current Medication list given to you today.   

## 2016-04-13 NOTE — Progress Notes (Signed)
SUBJECTIVE: The patient presents for follow-up of paroxysmal atrial fibrillation.  Echocardiogram 05/26/15 demonstrated normal left ventricular systolic function, EF 44-31%, mild LVH, grade 2 diastolic dysfunction, and mild left atrial enlargement. I started her on Xarelto 20 mg daily.  The patient denies any symptoms of chest pain, palpitations, shortness of breath, lightheadedness, dizziness, leg swelling, orthopnea, PND, and syncope.  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   Review of Systems: As per "subjective", otherwise negative.  No Known Allergies  Current Outpatient Prescriptions  Medication Sig Dispense Refill  . allopurinol (ZYLOPRIM) 100 MG tablet Take 1 tablet by mouth daily.    . benazepril (LOTENSIN) 20 MG tablet Take 1 tablet by mouth daily.    . cholecalciferol (VITAMIN D) 1000 units tablet Take 1,000 Units by mouth daily.    Marland Kitchen LANTUS SOLOSTAR 100 UNIT/ML Solostar Pen Inject 42 Units into the skin daily.    . metoprolol tartrate (LOPRESSOR) 25 MG tablet Take 1 tablet by mouth 2 (two) times daily.    . pravastatin (PRAVACHOL) 40 MG tablet Take 1 tablet by mouth daily.    . Rivaroxaban (XARELTO) 15 MG TABS tablet Take 1 tablet (15 mg total) by mouth daily with supper. 30 tablet 6   No current facility-administered medications for this visit.     Past Medical History:  Diagnosis Date  . Anemia   . CKD (chronic kidney disease)   . Diabetes ([place])    stage 4  . Hyperlipemia   . Hypertension     Past Surgical History:  Procedure Laterality Date  . KNEE ARTHROSCOPY Right   . LAPAROSCOPIC CHOLECYSTECTOMY    . TOTAL HIP ARTHROPLASTY Right     Social History   Social History  . Marital status: Widowed    Spouse name: N/A  . Number of children: N/A  . Years of education: N/A   Occupational History  . Not on file.   Social History Main Topics  . Smoking status: Former Smoker    Packs/day: 0.50    Years: 44.00    Types:  Cigarettes    Start date: 05/04/1970    Quit date: 05/04/2014  . Smokeless tobacco: Never Used  . Alcohol use Not on file  . Drug use: Unknown  . Sexual activity: Not on file   Other Topics Concern  . Not on file   Social History Narrative  . No narrative on file     Vitals:   04/13/16 1308  BP: (!) 168/74  Pulse: 64  Weight: 161 lb (73 kg)  Height: 5\' 7"  (1.702 m)    PHYSICAL EXAM General: NAD Neck: No JVD, no thyromegaly or thyroid nodule.  Lungs: Clear to auscultation bilaterally with normal respiratory effort. CV: Nondisplaced PMI. Regular rate and rhythm with occasional premature contractions, normal S1/S2, no V4/M0, soft 1/6 systolic murmur along left sternal border. No peripheral edema.  Abdomen: Soft, nontender, no hepatosplenomegaly, no distention.  Skin: Intact without lesions or rashes.  Neurologic: Alert and oriented x 3.  Psych: Normal affect. Extremities: No clubbing or cyanosis.  HEENT: Normal.     ECG: Most recent ECG reviewed.      ASSESSMENT AND PLAN:  1. Paroxsymal atrial fibrillation: Symptomatically stable on metoprolol. Continue Xarelto for thromboembolic risk reduction given CHA2DS2Vasc score of 4.  2. Essential HTN: Elevated. I have asked the patient to check blood pressure readings 4-5 times per week, at different times throughout the day, in order  to get a better approximation of mean BP values. These results will be provided to me at the end of that period so that I can determine if antihypertensive medication titration is indicated.  3. Hyperlipidemia: Continue pravastatin.  Dispo: f/u 1 year.  Kate Sable, M.D., F.A.C.C.

## 2016-04-16 DIAGNOSIS — D485 Neoplasm of uncertain behavior of skin: Secondary | ICD-10-CM | POA: Diagnosis not present

## 2016-04-16 DIAGNOSIS — L720 Epidermal cyst: Secondary | ICD-10-CM | POA: Diagnosis not present

## 2016-04-18 DIAGNOSIS — I10 Essential (primary) hypertension: Secondary | ICD-10-CM | POA: Diagnosis not present

## 2016-04-18 DIAGNOSIS — E875 Hyperkalemia: Secondary | ICD-10-CM | POA: Diagnosis not present

## 2016-04-18 DIAGNOSIS — R809 Proteinuria, unspecified: Secondary | ICD-10-CM | POA: Diagnosis not present

## 2016-04-18 DIAGNOSIS — N184 Chronic kidney disease, stage 4 (severe): Secondary | ICD-10-CM | POA: Diagnosis not present

## 2016-04-26 ENCOUNTER — Ambulatory Visit (INDEPENDENT_AMBULATORY_CARE_PROVIDER_SITE_OTHER): Payer: Medicare Other | Admitting: *Deleted

## 2016-04-26 DIAGNOSIS — I4891 Unspecified atrial fibrillation: Secondary | ICD-10-CM | POA: Diagnosis not present

## 2016-04-26 NOTE — Progress Notes (Signed)
Pt was started on Xarelto 20 mg daily for Atrial Fib on March 3,2017 .and the Xarelto was reduced to 15mg  daily due to CrCl in April 2017   Reviewed patients medication list. Pt is not currently on any combined P-gp and strong CYP3A4 inhibitors/inducers (ketoconazole, traconazole, ritonavir, carbamazepine, phenytoin, rifampin, St. John's wort). Reviewed labs. SCr 2.08, Weight 160lb , CrCl . Dose is appropriate based on CrCl 29.0. Hgb/Hct: 12.4/38.2  A full discussion of the nature of anticoagulants has been carried out. A benefit/risk analysis has been presented to the patient, so that they understand the justification for choosing anticoagulation with Xarelto The need for compliance is stressed. Pt is aware to take the medication once daily with the largest meal of the day. Side effects of potential bleeding are discussed, including unusual colored urine or stools, coughing up blood or coffee ground emesis, nose bleeds or serious fall or head trauma. Discussed signs and symptoms of stroke. The patient should avoid any OTC items containing aspirin or ibuprofen. Avoid alcohol consumption. Call if any signs of abnormal bleeding. Discussed financial obligations and states is not having any difficulty in obtaining medication. Next lab test in 31months with kidney MD.  F/U appt with me made for 08/2016. Pt states she has not missed any doses and hasn't had any signs or symptoms of bleeding. Denies any problems taking Xarelto.  Labs discussed with pt by Dr Lowanda Foster and are stable.  Continue current dose of Xarelto.

## 2016-06-21 ENCOUNTER — Other Ambulatory Visit: Payer: Self-pay | Admitting: Cardiovascular Disease

## 2016-07-25 DIAGNOSIS — E1169 Type 2 diabetes mellitus with other specified complication: Secondary | ICD-10-CM | POA: Diagnosis not present

## 2016-07-25 DIAGNOSIS — I10 Essential (primary) hypertension: Secondary | ICD-10-CM | POA: Diagnosis not present

## 2016-08-08 DIAGNOSIS — N183 Chronic kidney disease, stage 3 (moderate): Secondary | ICD-10-CM | POA: Diagnosis not present

## 2016-08-08 DIAGNOSIS — E559 Vitamin D deficiency, unspecified: Secondary | ICD-10-CM | POA: Diagnosis not present

## 2016-08-08 DIAGNOSIS — R809 Proteinuria, unspecified: Secondary | ICD-10-CM | POA: Diagnosis not present

## 2016-08-08 DIAGNOSIS — I129 Hypertensive chronic kidney disease with stage 1 through stage 4 chronic kidney disease, or unspecified chronic kidney disease: Secondary | ICD-10-CM | POA: Diagnosis not present

## 2016-08-08 DIAGNOSIS — D509 Iron deficiency anemia, unspecified: Secondary | ICD-10-CM | POA: Diagnosis not present

## 2016-08-08 DIAGNOSIS — Z79899 Other long term (current) drug therapy: Secondary | ICD-10-CM | POA: Diagnosis not present

## 2016-08-15 DIAGNOSIS — I10 Essential (primary) hypertension: Secondary | ICD-10-CM | POA: Diagnosis not present

## 2016-08-15 DIAGNOSIS — E1129 Type 2 diabetes mellitus with other diabetic kidney complication: Secondary | ICD-10-CM | POA: Diagnosis not present

## 2016-08-15 DIAGNOSIS — R809 Proteinuria, unspecified: Secondary | ICD-10-CM | POA: Diagnosis not present

## 2016-08-15 DIAGNOSIS — N184 Chronic kidney disease, stage 4 (severe): Secondary | ICD-10-CM | POA: Diagnosis not present

## 2016-08-28 ENCOUNTER — Ambulatory Visit (INDEPENDENT_AMBULATORY_CARE_PROVIDER_SITE_OTHER): Payer: Medicare Other | Admitting: *Deleted

## 2016-08-28 DIAGNOSIS — I4891 Unspecified atrial fibrillation: Secondary | ICD-10-CM | POA: Diagnosis not present

## 2016-08-28 NOTE — Progress Notes (Signed)
Pt was started on Xarelto 20 mg daily for Atrial Fib on March 3,2017 .and the Xarelto was reduced to 15mg  daily due to CrCl in April 2017   Reviewed patients medication list. Pt is notcurrently on any combined P-gp and strong CYP3A4 inhibitors/inducers (ketoconazole, traconazole, ritonavir, carbamazepine, phenytoin, rifampin, St. John's wort). Pt had labs in May by Dr Hinda Lenis. He told her her lab work was ok.  He keeps a watch on her kidneys.  We have requested her lab work several time but have not received it. Weight 74kg  A full discussion of the nature of anticoagulants has been carried out. A benefit/risk analysis has been presented to the patient, so that they understand the justification for choosing anticoagulation with Xarelto The need for compliance is stressed. Pt is aware to take the medication once daily with the largest meal of the day. Side effects of potential bleeding are discussed, including unusual colored urine or stools, coughing up blood or coffee ground emesis, nose bleeds or serious fall or head trauma. Discussed signs and symptoms of stroke. The patient should avoid any OTC items containing aspirin or ibuprofen. Avoid alcohol consumption. Call if any signs of abnormal bleeding. Discussed financial obligations and states is not having any difficulty in obtaining medication. Next lab test in 55months with kidney MD.  F/U appt with me in 4 months Pt states she has not missed any doses and hasn't had any signs or symptoms of bleeding. Denies any problems taking Xarelto. Labs discussed with pt by Dr Lowanda Foster and are stable.  Continue current dose of Xarelto.

## 2016-12-03 DIAGNOSIS — E1121 Type 2 diabetes mellitus with diabetic nephropathy: Secondary | ICD-10-CM | POA: Diagnosis not present

## 2016-12-03 DIAGNOSIS — I480 Paroxysmal atrial fibrillation: Secondary | ICD-10-CM | POA: Diagnosis not present

## 2016-12-03 DIAGNOSIS — N184 Chronic kidney disease, stage 4 (severe): Secondary | ICD-10-CM | POA: Diagnosis not present

## 2016-12-03 DIAGNOSIS — I10 Essential (primary) hypertension: Secondary | ICD-10-CM | POA: Diagnosis not present

## 2016-12-12 DIAGNOSIS — Z79899 Other long term (current) drug therapy: Secondary | ICD-10-CM | POA: Diagnosis not present

## 2016-12-12 DIAGNOSIS — I129 Hypertensive chronic kidney disease with stage 1 through stage 4 chronic kidney disease, or unspecified chronic kidney disease: Secondary | ICD-10-CM | POA: Diagnosis not present

## 2016-12-12 DIAGNOSIS — N183 Chronic kidney disease, stage 3 (moderate): Secondary | ICD-10-CM | POA: Diagnosis not present

## 2016-12-12 DIAGNOSIS — R809 Proteinuria, unspecified: Secondary | ICD-10-CM | POA: Diagnosis not present

## 2016-12-12 DIAGNOSIS — D509 Iron deficiency anemia, unspecified: Secondary | ICD-10-CM | POA: Diagnosis not present

## 2016-12-12 DIAGNOSIS — E559 Vitamin D deficiency, unspecified: Secondary | ICD-10-CM | POA: Diagnosis not present

## 2016-12-17 DIAGNOSIS — N184 Chronic kidney disease, stage 4 (severe): Secondary | ICD-10-CM | POA: Diagnosis not present

## 2016-12-17 DIAGNOSIS — Z23 Encounter for immunization: Secondary | ICD-10-CM | POA: Diagnosis not present

## 2016-12-17 DIAGNOSIS — I10 Essential (primary) hypertension: Secondary | ICD-10-CM | POA: Diagnosis not present

## 2016-12-17 DIAGNOSIS — E1129 Type 2 diabetes mellitus with other diabetic kidney complication: Secondary | ICD-10-CM | POA: Diagnosis not present

## 2016-12-17 DIAGNOSIS — R809 Proteinuria, unspecified: Secondary | ICD-10-CM | POA: Diagnosis not present

## 2017-02-22 DIAGNOSIS — E782 Mixed hyperlipidemia: Secondary | ICD-10-CM | POA: Diagnosis not present

## 2017-02-22 DIAGNOSIS — Z6825 Body mass index (BMI) 25.0-25.9, adult: Secondary | ICD-10-CM | POA: Diagnosis not present

## 2017-02-22 DIAGNOSIS — I10 Essential (primary) hypertension: Secondary | ICD-10-CM | POA: Diagnosis not present

## 2017-02-22 DIAGNOSIS — E119 Type 2 diabetes mellitus without complications: Secondary | ICD-10-CM | POA: Diagnosis not present

## 2017-03-29 ENCOUNTER — Other Ambulatory Visit: Payer: Self-pay | Admitting: Cardiovascular Disease

## 2017-04-17 ENCOUNTER — Encounter: Payer: Self-pay | Admitting: *Deleted

## 2017-04-18 ENCOUNTER — Ambulatory Visit: Payer: Medicare Other | Admitting: Cardiovascular Disease

## 2017-05-28 ENCOUNTER — Ambulatory Visit: Payer: Medicare Other | Admitting: Cardiovascular Disease

## 2017-05-28 ENCOUNTER — Encounter: Payer: Self-pay | Admitting: Cardiovascular Disease

## 2017-05-28 VITALS — BP 106/70 | HR 58 | Ht 67.0 in | Wt 164.0 lb

## 2017-05-28 DIAGNOSIS — I10 Essential (primary) hypertension: Secondary | ICD-10-CM

## 2017-05-28 DIAGNOSIS — E782 Mixed hyperlipidemia: Secondary | ICD-10-CM | POA: Diagnosis not present

## 2017-05-28 DIAGNOSIS — K219 Gastro-esophageal reflux disease without esophagitis: Secondary | ICD-10-CM

## 2017-05-28 DIAGNOSIS — I481 Persistent atrial fibrillation: Secondary | ICD-10-CM | POA: Diagnosis not present

## 2017-05-28 DIAGNOSIS — I4819 Other persistent atrial fibrillation: Secondary | ICD-10-CM

## 2017-05-28 MED ORDER — RIVAROXABAN 15 MG PO TABS: ORAL_TABLET | ORAL | 3 refills | Status: DC

## 2017-05-28 MED ORDER — OMEPRAZOLE 20 MG PO CPDR: 20.0000 mg | DELAYED_RELEASE_CAPSULE | Freq: Every day | ORAL | 3 refills | Status: DC

## 2017-05-28 NOTE — Progress Notes (Signed)
SUBJECTIVE:  The patient presents for follow-up of paroxysmal atrial fibrillation.  Echocardiogram 05/26/15 demonstrated normal left ventricular systolic function, EF 35-00%, mild LVH, grade 2 diastolic dysfunction, and mild left atrial enlargement. I started her on Xarelto 20 mg daily.  She denies exertional chest pain, palpitations, leg swelling, orthopnea, dizziness, syncope, and shortness of breath.  Primary complaints relate to acid reflux disease with symptoms occurring every other day.  She notices that it is aggravated by pizza which she seldomly eats.    Review of Systems: As per "subjective", otherwise negative.  No Known Allergies  Current Outpatient Medications  Medication Sig Dispense Refill  . allopurinol (ZYLOPRIM) 100 MG tablet Take 1 tablet by mouth daily.    . benazepril (LOTENSIN) 20 MG tablet Take 1 tablet by mouth daily.    . cholecalciferol (VITAMIN D) 1000 units tablet Take 1,000 Units by mouth daily.    Marland Kitchen LANTUS SOLOSTAR 100 UNIT/ML Solostar Pen Inject 42 Units into the skin daily.    . metoprolol tartrate (LOPRESSOR) 25 MG tablet Take 1 tablet by mouth 2 (two) times daily.    . pravastatin (PRAVACHOL) 40 MG tablet Take 1 tablet by mouth daily.    Alveda Reasons 15 MG TABS tablet TAKE 1 TABLET BY MOUTH ONCE DAILY WITH SUPPER 30 tablet 0   No current facility-administered medications for this visit.     Past Medical History:  Diagnosis Date  . Anemia   . CKD (chronic kidney disease)   . Diabetes ([place])    stage 4  . Hyperlipemia   . Hypertension     Past Surgical History:  Procedure Laterality Date  . KNEE ARTHROSCOPY Right   . LAPAROSCOPIC CHOLECYSTECTOMY    . TOTAL HIP ARTHROPLASTY Right     Social History   Socioeconomic History  . Marital status: Widowed    Spouse name: Not on file  . Number of children: Not on file  . Years of education: Not on file  . Highest education level: Not on file  Social Needs  . Financial resource strain:  Not on file  . Food insecurity - worry: Not on file  . Food insecurity - inability: Not on file  . Transportation needs - medical: Not on file  . Transportation needs - non-medical: Not on file  Occupational History  . Not on file  Tobacco Use  . Smoking status: Former Smoker    Packs/day: 0.50    Years: 44.00    Pack years: 22.00    Types: Cigarettes    Start date: 05/04/1970    Last attempt to quit: 05/04/2014    Years since quitting: 3.0  . Smokeless tobacco: Never Used  Substance and Sexual Activity  . Alcohol use: Not on file  . Drug use: Not on file  . Sexual activity: Not on file  Other Topics Concern  . Not on file  Social History Narrative  . Not on file     Vitals:   05/28/17 1124  BP: 106/70  Pulse: (!) 58  SpO2: 96%  Weight: 164 lb (74.4 kg)  Height: 5\' 7"  (1.702 m)    Wt Readings from Last 3 Encounters:  05/28/17 164 lb (74.4 kg)  04/13/16 161 lb (73 kg)  06/10/15 153 lb (69.4 kg)     PHYSICAL EXAM General: NAD HEENT: Normal. Neck: No JVD, no thyromegaly. Lungs: Clear to auscultation bilaterally with normal respiratory effort. CV: Regular rate and irregular rhythm, normal S1/S2, no S3, soft  1/6 systolic murmur along left sternal border. No pretibial or periankle edema.  No carotid bruit.   Abdomen: Soft, nontender, no distention.  Neurologic: Alert and oriented.  Psych: Normal affect. Skin: Normal. Musculoskeletal: No gross deformities.    ECG: Most recent ECG reviewed.   Labs: Lab Results  Component Value Date/Time   K 3.9 08/07/2010 05:40 AM   BUN 13 08/07/2010 05:40 AM   CREATININE 1.34 (H) 08/07/2010 05:40 AM   HGB 8.3 (L) 08/08/2010 08:27 AM     Lipids: No results found for: LDLCALC, LDLDIRECT, CHOL, TRIG, HDL     ASSESSMENT AND PLAN:  1.  Persistent atrial fibrillation: Symptomatically stable on metoprolol. Continue Xarelto for thromboembolic risk reduction given CHA2DS2Vasc score of 4.  2. Essential HTN:  Blood pressure  is normal.  No changes to therapy.  3. Hyperlipidemia: Continue pravastatin.  4.  GERD: I will start omeprazole 20 mg daily and attempt to treat symptoms for 2 months.  I educated her on which foods provoke symptoms related to acid reflux disease.   Disposition: Follow up 1 year   Kate Sable, M.D., F.A.C.C.

## 2017-05-28 NOTE — Patient Instructions (Signed)
Medication Instructions:   Begin Omeprazole 20mg  daily.  Please try daily for 2 months, along with dietary modification and then STOP.  Continue all other medications.    Labwork: none  Testing/Procedures: none  Follow-Up: Your physician wants you to follow up in:  1 year.  You will receive a reminder letter in the mail one-two months in advance.  If you don't receive a letter, please call our office to schedule the follow up appointment   Any Other Special Instructions Will Be Listed Below (If Applicable).  If you need a refill on your cardiac medications before your next appointment, please call your pharmacy.

## 2017-05-29 DIAGNOSIS — Z6825 Body mass index (BMI) 25.0-25.9, adult: Secondary | ICD-10-CM | POA: Diagnosis not present

## 2017-05-29 DIAGNOSIS — E782 Mixed hyperlipidemia: Secondary | ICD-10-CM | POA: Diagnosis not present

## 2017-05-29 DIAGNOSIS — I10 Essential (primary) hypertension: Secondary | ICD-10-CM | POA: Diagnosis not present

## 2017-05-29 DIAGNOSIS — N184 Chronic kidney disease, stage 4 (severe): Secondary | ICD-10-CM | POA: Diagnosis not present

## 2017-05-29 DIAGNOSIS — E119 Type 2 diabetes mellitus without complications: Secondary | ICD-10-CM | POA: Diagnosis not present

## 2017-08-09 ENCOUNTER — Telehealth: Payer: Self-pay | Admitting: Cardiovascular Disease

## 2017-08-09 NOTE — Telephone Encounter (Signed)
Patient notified she can pick up samples next week & will put patient assistance application in the bag.  States she is in the coverage gap now.  Was only paying $45

## 2017-08-09 NOTE — Telephone Encounter (Signed)
Patient called stating that the medication Rivaroxaban (XARELTO) 15 MG TABS has increased to $119.00. States that she cannot afford this.  Please advise if she can be switched to another medication.  [place], [place]

## 2017-08-13 MED ORDER — RIVAROXABAN 15 MG PO TABS: ORAL_TABLET | ORAL | 0 refills | Status: DC

## 2017-12-18 ENCOUNTER — Other Ambulatory Visit: Payer: Self-pay | Admitting: Cardiovascular Disease

## 2018-02-05 DIAGNOSIS — Z6826 Body mass index (BMI) 26.0-26.9, adult: Secondary | ICD-10-CM | POA: Diagnosis not present

## 2018-02-05 DIAGNOSIS — Z6825 Body mass index (BMI) 25.0-25.9, adult: Secondary | ICD-10-CM | POA: Diagnosis not present

## 2018-02-05 DIAGNOSIS — E782 Mixed hyperlipidemia: Secondary | ICD-10-CM | POA: Diagnosis not present

## 2018-02-05 DIAGNOSIS — Z6830 Body mass index (BMI) 30.0-30.9, adult: Secondary | ICD-10-CM | POA: Diagnosis not present

## 2018-02-05 DIAGNOSIS — I10 Essential (primary) hypertension: Secondary | ICD-10-CM | POA: Diagnosis not present

## 2018-02-05 DIAGNOSIS — E119 Type 2 diabetes mellitus without complications: Secondary | ICD-10-CM | POA: Diagnosis not present

## 2018-02-05 DIAGNOSIS — N184 Chronic kidney disease, stage 4 (severe): Secondary | ICD-10-CM | POA: Diagnosis not present

## 2018-02-10 DIAGNOSIS — E875 Hyperkalemia: Secondary | ICD-10-CM | POA: Diagnosis not present

## 2018-02-25 DIAGNOSIS — E875 Hyperkalemia: Secondary | ICD-10-CM | POA: Diagnosis not present

## 2018-03-28 DIAGNOSIS — I10 Essential (primary) hypertension: Secondary | ICD-10-CM | POA: Diagnosis not present

## 2018-03-28 DIAGNOSIS — R809 Proteinuria, unspecified: Secondary | ICD-10-CM | POA: Diagnosis not present

## 2018-03-28 DIAGNOSIS — D509 Iron deficiency anemia, unspecified: Secondary | ICD-10-CM | POA: Diagnosis not present

## 2018-03-28 DIAGNOSIS — N183 Chronic kidney disease, stage 3 (moderate): Secondary | ICD-10-CM | POA: Diagnosis not present

## 2018-03-28 DIAGNOSIS — E559 Vitamin D deficiency, unspecified: Secondary | ICD-10-CM | POA: Diagnosis not present

## 2018-03-28 DIAGNOSIS — Z79899 Other long term (current) drug therapy: Secondary | ICD-10-CM | POA: Diagnosis not present

## 2018-04-18 ENCOUNTER — Telehealth: Payer: Self-pay | Admitting: Cardiovascular Disease

## 2018-04-18 MED ORDER — RIVAROXABAN 15 MG PO TABS: ORAL_TABLET | ORAL | 6 refills | Status: DC

## 2018-04-18 NOTE — Telephone Encounter (Signed)
Patient needs [facility] refills sent to Kerri Brown  She has been told by her kidney doctor that she needs to come off Sundown due to needing a procedure.   She has question as to if she needs to continue taking

## 2018-04-18 NOTE — Telephone Encounter (Signed)
Patient notified that Xarelto refill sent to [facility] as requested.  Patient also stated that she is needing procedure to be able to get dialysis.  Informed patient that she needs to have surgeon fax Dr. Bronson Ing a cardiac clearance request stating exactly what procedure he is doing and what medications may be in question.  Patient already has her 1 year follow up scheduled for 06/30/2018 with SK in [place] - he can address this clearance at that office visit.  If needing prior to that date, patient will let us know.  She verbalized understanding.

## 2018-05-29 DIAGNOSIS — H35039 Hypertensive retinopathy, unspecified eye: Secondary | ICD-10-CM | POA: Diagnosis not present

## 2018-06-30 ENCOUNTER — Encounter: Payer: Self-pay | Admitting: Cardiovascular Disease

## 2018-06-30 ENCOUNTER — Ambulatory Visit: Payer: Medicare Other | Admitting: Cardiovascular Disease

## 2018-08-06 DIAGNOSIS — N184 Chronic kidney disease, stage 4 (severe): Secondary | ICD-10-CM | POA: Diagnosis not present

## 2018-08-06 DIAGNOSIS — I10 Essential (primary) hypertension: Secondary | ICD-10-CM | POA: Diagnosis not present

## 2018-08-06 DIAGNOSIS — E782 Mixed hyperlipidemia: Secondary | ICD-10-CM | POA: Diagnosis not present

## 2018-08-06 DIAGNOSIS — E119 Type 2 diabetes mellitus without complications: Secondary | ICD-10-CM | POA: Diagnosis not present

## 2018-09-17 ENCOUNTER — Other Ambulatory Visit: Payer: Self-pay | Admitting: Cardiovascular Disease

## 2018-10-29 ENCOUNTER — Encounter (HOSPITAL_COMMUNITY): Payer: Self-pay | Admitting: Emergency Medicine

## 2018-10-29 ENCOUNTER — Emergency Department (HOSPITAL_COMMUNITY)
Admission: EM | Admit: 2018-10-29 | Discharge: 2018-10-29 | Disposition: A | Discharge status: Home / Self Care | Payer: Medicare Other | Attending: Emergency Medicine | Admitting: Emergency Medicine

## 2018-10-29 ENCOUNTER — Other Ambulatory Visit: Payer: Self-pay

## 2018-10-29 ENCOUNTER — Emergency Department (HOSPITAL_COMMUNITY): Payer: Medicare Other

## 2018-10-29 DIAGNOSIS — S39012A Strain of muscle, fascia and tendon of lower back, initial encounter: Secondary | ICD-10-CM | POA: Diagnosis not present

## 2018-10-29 DIAGNOSIS — X500XXA Overexertion from strenuous movement or load, initial encounter: Secondary | ICD-10-CM | POA: Insufficient documentation

## 2018-10-29 DIAGNOSIS — Z79899 Other long term (current) drug therapy: Secondary | ICD-10-CM | POA: Diagnosis not present

## 2018-10-29 DIAGNOSIS — Y999 Unspecified external cause status: Secondary | ICD-10-CM | POA: Diagnosis not present

## 2018-10-29 DIAGNOSIS — N189 Chronic kidney disease, unspecified: Secondary | ICD-10-CM | POA: Insufficient documentation

## 2018-10-29 DIAGNOSIS — Z87891 Personal history of nicotine dependence: Secondary | ICD-10-CM | POA: Insufficient documentation

## 2018-10-29 DIAGNOSIS — Z794 Long term (current) use of insulin: Secondary | ICD-10-CM | POA: Insufficient documentation

## 2018-10-29 DIAGNOSIS — Y9389 Activity, other specified: Secondary | ICD-10-CM | POA: Insufficient documentation

## 2018-10-29 DIAGNOSIS — Y92017 Garden or yard in single-family (private) house as the place of occurrence of the external cause: Secondary | ICD-10-CM | POA: Insufficient documentation

## 2018-10-29 DIAGNOSIS — E1122 Type 2 diabetes mellitus with diabetic chronic kidney disease: Secondary | ICD-10-CM | POA: Diagnosis not present

## 2018-10-29 DIAGNOSIS — S3982XA Other specified injuries of lower back, initial encounter: Secondary | ICD-10-CM | POA: Diagnosis present

## 2018-10-29 DIAGNOSIS — M545 Low back pain: Secondary | ICD-10-CM | POA: Diagnosis not present

## 2018-10-29 LAB — URINALYSIS, ROUTINE W REFLEX MICROSCOPIC
Bacteria, UA: NONE SEEN
Bilirubin Urine: NEGATIVE
Glucose, UA: 50 mg/dL — AB
Hgb urine dipstick: NEGATIVE
Ketones, ur: NEGATIVE mg/dL
Leukocytes,Ua: NEGATIVE
Nitrite: NEGATIVE
Protein, ur: 100 mg/dL — AB
Specific Gravity, Urine: 1.009 (ref 1.005–1.030)
pH: 7 (ref 5.0–8.0)

## 2018-10-29 MED ORDER — PREDNISONE 20 MG PO TABS
40.0000 mg | ORAL_TABLET | Freq: Once | ORAL | Status: AC
  Administered 2018-10-29: 14:00:00 40 mg via ORAL
  Filled 2018-10-29: qty 2

## 2018-10-29 MED ORDER — HYDROCODONE-ACETAMINOPHEN 5-325 MG PO TABS
1.0000 | ORAL_TABLET | Freq: Once | ORAL | Status: AC
  Administered 2018-10-29: 1 via ORAL
  Filled 2018-10-29: qty 1

## 2018-10-29 NOTE — Discharge Instructions (Addendum)
Please read instructions below.  You can take tylenol every 6 hours as needed for pain.   Apply ice to your back for 20 minutes at a time.  You can also apply heat if this provides more relief.   Monitor your blood sugar today and tomorrow. The steroid you got here in the ED may cause your blood sugar to elevate. Stay hydrated. Follow-up with your primary care provider to discuss your visit today and re-check your blood pressure. Return to ER if new numbness or tingling in your arms or legs, inability to urinate, inability to hold your bowels, or weakness in your extremities.

## 2018-10-29 NOTE — ED Provider Notes (Signed)
[facility] EMERGENCY DEPARTMENT Provider Note   CSN: [id number] Arrival date & time: 10/29/18  1036    History   Chief Complaint Chief Complaint  Patient presents with   Back Pain    HPI Kerri Brown is a 73 y.o. female w PMHx CKD, DM, HTN, HLD, anemia, presenting to the emergency department with complaint of July onset of bilateral lower back pain that began 2 weeks ago after moving some fallen branches from her yard.  She states she had no sudden pain after that, however the day following she developed gradual onset of lower back ache that is worse with movement.  She states she has been leaning on her right arm more frequently to adjust herself in bed and is become sore as well up in the shoulder.  She has been taking Tylenol for her symptoms, states she takes 2 Tylenol in the morning and 1 at bedtime, as well as applies Aspercreme.  She states it is not providing significant enough relief.  She has pain with walking.  Denies numbness or weakness, bowel or bladder incontinence, saddle paresthesia, fever, history of cancer, history of IV drug use, urinary symptoms.  She has not called her PCP for evaluation of this.     The history is provided by the patient.    Past Medical History:  Diagnosis Date   Anemia    CKD (chronic kidney disease)    Diabetes ([place])    stage 4   Hyperlipemia    Hypertension     There are no active problems to display for this patient.   Past Surgical History:  Procedure Laterality Date   KNEE ARTHROSCOPY Right    LAPAROSCOPIC CHOLECYSTECTOMY     TOTAL HIP ARTHROPLASTY Right      OB History   No obstetric history on file.      Home Medications    Prior to Admission medications   Medication Sig Start Date End Date Taking? Authorizing Provider  allopurinol (ZYLOPRIM) 100 MG tablet Take 1 tablet by mouth daily. 03/01/15  Yes [provider]  cholecalciferol (VITAMIN D) 1000 units tablet Take 1,000 Units by mouth  daily.   Yes [provider]  LANTUS SOLOSTAR 100 UNIT/ML Solostar Pen Inject 42 Units into the skin daily. 04/05/15  Yes [provider]  metoprolol tartrate (LOPRESSOR) 25 MG tablet Take 1 tablet by mouth 2 (two) times daily. 04/05/15  Yes [provider]  pravastatin (PRAVACHOL) 40 MG tablet Take 1 tablet by mouth daily. 03/01/15  Yes [provider]  Rivaroxaban (XARELTO) 15 MG TABS tablet TAKE 1 TABLET BY MOUTH ONCE DAILY WITH SUPPER Patient taking differently: Take 15 mg by mouth daily. TAKE 1 TABLET BY MOUTH ONCE DAILY WITH SUPPER 04/18/18  Yes Herminio Commons, MD  benazepril (LOTENSIN) 20 MG tablet Take 1 tablet by mouth daily. 02/02/15   [provider]  omeprazole (PRILOSEC) 20 MG capsule Take 1 capsule by mouth once daily Patient not taking: Reported on 10/29/2018 09/17/18   Herminio Commons, MD    Family History Family History  Problem Relation Age of Onset   Heart disease Father        had a pacemaker   Diabetes Father    Diabetes Sister    Diabetes Sister    Diabetes Other     Social History Social History   Tobacco Use   Smoking status: Former Smoker    Packs/day: 0.50    Years: 44.00  Pack years: 22.00    Types: Cigarettes    Start date: 05/04/1970    Quit date: 05/04/2014    Years since quitting: 4.4   Smokeless tobacco: Never Used  Substance Use Topics   Alcohol use: Never    Alcohol/week: 0.0 standard drinks    Frequency: Never   Drug use: Never     Allergies   Patient has no known allergies.   Review of Systems Review of Systems  Musculoskeletal: Positive for back pain and myalgias.  All other systems reviewed and are negative.    Physical Exam Updated Vital Signs BP (!) 174/79    Pulse (!) 49    Temp 98.4 F (36.9 C) (Oral)    Resp 18    Ht 5\' 7"  (1.702 m)    Wt 73.5 kg    SpO2 98%    BMI 25.37 kg/m   Physical Exam Vitals signs and nursing note reviewed.  Constitutional:       General: She is not in acute distress.    Appearance: She is well-developed.  HENT:     Head: Normocephalic and atraumatic.  Eyes:     Conjunctiva/sclera: Conjunctivae normal.  Cardiovascular:     Rate and Rhythm: Normal rate and regular rhythm.  Pulmonary:     Effort: Pulmonary effort is normal. No respiratory distress.     Breath sounds: Normal breath sounds.  Abdominal:     General: Bowel sounds are normal.     Palpations: Abdomen is soft.     Tenderness: There is no abdominal tenderness. There is no guarding or rebound.  Musculoskeletal:     Comments: Generalized L-spine and paraspinal tenderness, no bony step-offs or gross deformities.  Negative straight right leg raise b/l.  Right shoulder with generalized tenderness musculature, normal range of motion.  Skin:    General: Skin is warm.  Neurological:     Mental Status: She is alert.     Comments: Normal tone.  5/5 strength in BUE and BLE including strong and equal grip strength and dorsiflexion/plantar flexion Sensory: Pinprick and light touch normal in BLE extremities.  CV: distal pulses palpable throughout    Psychiatric:        Behavior: Behavior normal.      ED Treatments / Results  Labs (all labs ordered are listed, but only abnormal results are displayed) Labs Reviewed  URINALYSIS, ROUTINE W REFLEX MICROSCOPIC - Abnormal; Notable for the following components:      Result Value   Color, Urine STRAW (*)    Glucose, UA 50 (*)    Protein, ur 100 (*)    All other components within normal limits    EKG None  Radiology Dg Lumbar Spine Complete  Result Date: 10/29/2018 CLINICAL DATA:  Low back pain EXAM: LUMBAR SPINE - COMPLETE 4+ VIEW COMPARISON:  None. FINDINGS: Five lumbar type vertebral segments. Vertebral body heights and alignment are maintained. No fracture identified. There is intervertebral disc height loss at L5-S1. Minimal degenerative endplate changes. Mild lower lumbar facet arthrosis. IMPRESSION:  Mild lower lumbar spondylosis.  No acute findings. Electronically Signed   By: Davina Poke M.D.   On: 10/29/2018 13:04    Procedures Procedures (including critical care time)  Medications Ordered in ED Medications  HYDROcodone-acetaminophen (NORCO/VICODIN) 5-325 MG per tablet 1 tablet (1 tablet Oral Given 10/29/18 1235)  predniSONE (DELTASONE) tablet 40 mg (40 mg Oral Given 10/29/18 1412)     Initial Impression / Assessment and Plan / ED Course  I  have reviewed the triage vital signs and the nursing notes.  Pertinent labs & imaging results that were available during my care of the patient were reviewed by me and considered in my medical decision making (see chart for details).        Patient with back pain after moving branches from her yard. No distress, neg straight leg raise. No neurological deficits and normal neuro exam.  Patient can walk but states is painful.  No loss of bowel or bladder control.  No concern for cauda equina.  No fever, night sweats, weight loss, h/o cancer, IVDU.  RICE protocol and pain medicine indicated and discussed with patient. Recommend pt document blood pressure log and f/u with pcp. BP improved on my re-eval after pain medication.   Discussed patient with Dr. Roderic Palau, who agrees with workup and care plan.  Discussed results, findings, treatment and follow up. Patient advised of return precautions. Patient verbalized understanding and agreed with plan.   Final Clinical Impressions(s) / ED Diagnoses   Final diagnoses:  Strain of lumbar region, initial encounter    ED Discharge Orders    None       Vannie Hilgert, [place] N, PA-C 10/29/18 1428    Milton Ferguson, MD 11/04/18 1305

## 2018-10-29 NOTE — ED Triage Notes (Signed)
Pt reports that she was picking up limbs approx 2 weeks ago and lower back and down left have been hurting and has been gradually worse. Also states right shoulder is hurting due to using arm to position self in bed

## 2018-11-03 DIAGNOSIS — M545 Low back pain: Secondary | ICD-10-CM | POA: Diagnosis not present

## 2018-11-04 ENCOUNTER — Other Ambulatory Visit: Payer: Self-pay

## 2018-11-04 ENCOUNTER — Encounter: Payer: Self-pay | Admitting: Cardiovascular Disease

## 2018-11-04 ENCOUNTER — Ambulatory Visit (INDEPENDENT_AMBULATORY_CARE_PROVIDER_SITE_OTHER): Payer: Medicare Other | Admitting: Cardiovascular Disease

## 2018-11-04 VITALS — BP 162/100 | HR 57 | Temp 99.1°F | Ht 67.0 in | Wt 169.0 lb

## 2018-11-04 DIAGNOSIS — I4819 Other persistent atrial fibrillation: Secondary | ICD-10-CM | POA: Diagnosis not present

## 2018-11-04 DIAGNOSIS — E782 Mixed hyperlipidemia: Secondary | ICD-10-CM

## 2018-11-04 DIAGNOSIS — K219 Gastro-esophageal reflux disease without esophagitis: Secondary | ICD-10-CM

## 2018-11-04 DIAGNOSIS — I10 Essential (primary) hypertension: Secondary | ICD-10-CM

## 2018-11-04 MED ORDER — RIVAROXABAN 15 MG PO TABS: 15.0000 mg | ORAL_TABLET | Freq: Every day | ORAL | 0 refills | Status: DC

## 2018-11-04 MED ORDER — BENAZEPRIL HCL 40 MG PO TABS: 40.0000 mg | ORAL_TABLET | Freq: Every day | ORAL | 2 refills | Status: DC

## 2018-11-04 NOTE — Progress Notes (Signed)
SUBJECTIVE: The patient presents for follow-up of paroxysmal atrial fibrillation.  Echocardiogram 3/9/17demonstrated normal left ventricular systolic function, EF 46-50%, mild LVH, grade 2 diastolic dysfunction, and mild left atrial enlargement. I started her on Xarelto 20 mg daily.  The patient denies any symptoms of chest pain, palpitations, shortness of breath, lightheadedness, dizziness, leg swelling, orthopnea, PND, and syncope.  Xarelto went up to $147 per month so she stopped taking it about a week ago.  She was doing outdoor work and carrying tree limbs and twisted her lower back and has some lower back pain from this.    Review of Systems: As per "subjective", otherwise negative.  No Known Allergies  Current Outpatient Medications  Medication Sig Dispense Refill  . allopurinol (ZYLOPRIM) 100 MG tablet Take 1 tablet by mouth daily.    . benazepril (LOTENSIN) 20 MG tablet Take 1 tablet by mouth daily.    . cholecalciferol (VITAMIN D) 1000 units tablet Take 1,000 Units by mouth daily.    Marland Kitchen LANTUS SOLOSTAR 100 UNIT/ML Solostar Pen Inject 42 Units into the skin daily.    . metoprolol tartrate (LOPRESSOR) 25 MG tablet Take 1 tablet by mouth 2 (two) times daily.    Marland Kitchen omeprazole (PRILOSEC) 20 MG capsule Take 1 capsule by mouth once daily 90 capsule 0  . pravastatin (PRAVACHOL) 40 MG tablet Take 1 tablet by mouth daily.     No current facility-administered medications for this visit.     Past Medical History:  Diagnosis Date  . Anemia   . CKD (chronic kidney disease)   . Diabetes ([place])    stage 4  . Hyperlipemia   . Hypertension     Past Surgical History:  Procedure Laterality Date  . KNEE ARTHROSCOPY Right   . LAPAROSCOPIC CHOLECYSTECTOMY    . TOTAL HIP ARTHROPLASTY Right     Social History   Socioeconomic History  . Marital status: Widowed    Spouse name: Not on file  . Number of children: Not on file  . Years of education: Not on file  . Highest  education level: Not on file  Occupational History  . Not on file  Social Needs  . Financial resource strain: Not on file  . Food insecurity    Worry: Not on file    Inability: Not on file  . Transportation needs    Medical: Not on file    Non-medical: Not on file  Tobacco Use  . Smoking status: Former Smoker    Packs/day: 0.50    Years: 44.00    Pack years: 22.00    Types: Cigarettes    Start date: 05/04/1970    Quit date: 05/04/2014    Years since quitting: 4.5  . Smokeless tobacco: Never Used  Substance and Sexual Activity  . Alcohol use: Never    Alcohol/week: 0.0 standard drinks    Frequency: Never  . Drug use: Never  . Sexual activity: Not on file  Lifestyle  . Physical activity    Days per week: Not on file    Minutes per session: Not on file  . Stress: Not on file  Relationships  . Social Herbalist on phone: Not on file    Gets together: Not on file    Attends religious service: Not on file    Active member of club or organization: Not on file    Attends meetings of clubs or organizations: Not on file    Relationship status:  Not on file  . Intimate partner violence    Fear of current or ex partner: Not on file    Emotionally abused: Not on file    Physically abused: Not on file    Forced sexual activity: Not on file  Other Topics Concern  . Not on file  Social History Narrative  . Not on file     Vitals:   11/04/18 1340  BP: (!) 162/100  Pulse: (!) 57  Temp: 99.1 F (37.3 C)  SpO2: 98%  Weight: 169 lb (76.7 kg)  Height: 5\' 7"  (1.702 m)    Wt Readings from Last 3 Encounters:  11/04/18 169 lb (76.7 kg)  10/29/18 162 lb (73.5 kg)  05/28/17 164 lb (74.4 kg)     PHYSICAL EXAM General: NAD HEENT: Normal. Neck: No JVD, no thyromegaly. Lungs: Clear to auscultation bilaterally with normal respiratory effort. CV: Regular rate and regular rhythm with premature contractions, normal S1/S2, no O0/H2, 1/6 systolic murmur along left sternal  border. No pretibial or periankle edema.  No carotid bruit.   Abdomen: Soft, nontender, no distention.  Neurologic: Alert and oriented.  Psych: Normal affect. Skin: Normal. Musculoskeletal: No gross deformities.    ECG: Sinus rhythm with sinus arrhythmia, right bundle branch block, left anterior fascicular block.  This was personally reviewed.   Labs: Lab Results  Component Value Date/Time   K 3.9 08/07/2010 05:40 AM   BUN 13 08/07/2010 05:40 AM   CREATININE 1.34 (H) 08/07/2010 05:40 AM   HGB 8.3 (L) 08/08/2010 08:27 AM     Lipids: No results found for: LDLCALC, LDLDIRECT, CHOL, TRIG, HDL     ASSESSMENT AND PLAN: 1.  Persistent atrial fibrillation: Symptomatically stable on metoprolol.  She had been on Xarelto for thromboembolic risk reduction given CHA2DS2Vasc score of 4.  It has become too costly now costing her $147 per month.  I will provide samples.  I will see if there is any opportunity for patient assistance.  If not, I will switch her to warfarin.  2. Essential HTN: Blood pressure is significantly elevated.  I will increase benazepril to 40 mg daily.  3. Hyperlipidemia: Continue pravastatin.  4.  GERD: Continue omeprazole 20 mg.    Disposition: Follow up 1 year   Kate Sable, M.D., F.A.C.C.

## 2018-11-04 NOTE — Patient Instructions (Addendum)
Medication Instructions:    Your physician has recommended you make the following change in your medication:   Increase benazepril to 40 mg by mouth daily  Continue all other medications the same  Labwork:  NONE  Testing/Procedures:  NONE  Follow-Up:  Your physician recommends that you schedule a follow-up appointment in: 1 year. You will receive a reminder letter in the mail in about 10 months reminding you to call and schedule your appointment. If you don't receive this letter, please contact our office.  Any Other Special Instructions Will Be Listed Below (If Applicable).  Patient assistance information has been given to you today for your xarelto.  Please call [phone] about the LIS program  If you need a refill on your cardiac medications before your next appointment, please call your pharmacy.

## 2018-11-19 DIAGNOSIS — K921 Melena: Secondary | ICD-10-CM | POA: Diagnosis not present

## 2018-11-19 DIAGNOSIS — M791 Myalgia, unspecified site: Secondary | ICD-10-CM | POA: Diagnosis not present

## 2018-11-19 DIAGNOSIS — N184 Chronic kidney disease, stage 4 (severe): Secondary | ICD-10-CM | POA: Diagnosis not present

## 2018-11-19 DIAGNOSIS — M545 Low back pain: Secondary | ICD-10-CM | POA: Diagnosis not present

## 2018-11-27 DIAGNOSIS — K921 Melena: Secondary | ICD-10-CM | POA: Diagnosis not present

## 2018-11-27 DIAGNOSIS — Z23 Encounter for immunization: Secondary | ICD-10-CM | POA: Diagnosis not present

## 2018-11-27 DIAGNOSIS — M791 Myalgia, unspecified site: Secondary | ICD-10-CM | POA: Diagnosis not present

## 2018-11-27 DIAGNOSIS — N184 Chronic kidney disease, stage 4 (severe): Secondary | ICD-10-CM | POA: Diagnosis not present

## 2018-11-27 DIAGNOSIS — M7501 Adhesive capsulitis of right shoulder: Secondary | ICD-10-CM | POA: Diagnosis not present

## 2018-11-27 DIAGNOSIS — M545 Low back pain: Secondary | ICD-10-CM | POA: Diagnosis not present

## 2018-11-27 DIAGNOSIS — M7502 Adhesive capsulitis of left shoulder: Secondary | ICD-10-CM | POA: Diagnosis not present

## 2018-12-01 DIAGNOSIS — H35039 Hypertensive retinopathy, unspecified eye: Secondary | ICD-10-CM | POA: Diagnosis not present

## 2018-12-01 DIAGNOSIS — H2513 Age-related nuclear cataract, bilateral: Secondary | ICD-10-CM | POA: Diagnosis not present

## 2018-12-04 DIAGNOSIS — M7502 Adhesive capsulitis of left shoulder: Secondary | ICD-10-CM | POA: Diagnosis not present

## 2018-12-04 DIAGNOSIS — M542 Cervicalgia: Secondary | ICD-10-CM | POA: Diagnosis not present

## 2018-12-04 DIAGNOSIS — M545 Low back pain: Secondary | ICD-10-CM | POA: Diagnosis not present

## 2018-12-04 DIAGNOSIS — M7501 Adhesive capsulitis of right shoulder: Secondary | ICD-10-CM | POA: Diagnosis not present

## 2018-12-08 DIAGNOSIS — M7502 Adhesive capsulitis of left shoulder: Secondary | ICD-10-CM | POA: Diagnosis not present

## 2018-12-08 DIAGNOSIS — M542 Cervicalgia: Secondary | ICD-10-CM | POA: Diagnosis not present

## 2018-12-08 DIAGNOSIS — M545 Low back pain: Secondary | ICD-10-CM | POA: Diagnosis not present

## 2018-12-08 DIAGNOSIS — M7501 Adhesive capsulitis of right shoulder: Secondary | ICD-10-CM | POA: Diagnosis not present

## 2018-12-11 DIAGNOSIS — M7501 Adhesive capsulitis of right shoulder: Secondary | ICD-10-CM | POA: Diagnosis not present

## 2018-12-11 DIAGNOSIS — M542 Cervicalgia: Secondary | ICD-10-CM | POA: Diagnosis not present

## 2018-12-11 DIAGNOSIS — M7502 Adhesive capsulitis of left shoulder: Secondary | ICD-10-CM | POA: Diagnosis not present

## 2018-12-11 DIAGNOSIS — M545 Low back pain: Secondary | ICD-10-CM | POA: Diagnosis not present

## 2018-12-15 DIAGNOSIS — M7502 Adhesive capsulitis of left shoulder: Secondary | ICD-10-CM | POA: Diagnosis not present

## 2018-12-15 DIAGNOSIS — M7501 Adhesive capsulitis of right shoulder: Secondary | ICD-10-CM | POA: Diagnosis not present

## 2018-12-15 DIAGNOSIS — M542 Cervicalgia: Secondary | ICD-10-CM | POA: Diagnosis not present

## 2018-12-15 DIAGNOSIS — M545 Low back pain: Secondary | ICD-10-CM | POA: Diagnosis not present

## 2018-12-18 DIAGNOSIS — M545 Low back pain: Secondary | ICD-10-CM | POA: Diagnosis not present

## 2018-12-18 DIAGNOSIS — M542 Cervicalgia: Secondary | ICD-10-CM | POA: Diagnosis not present

## 2018-12-18 DIAGNOSIS — M7501 Adhesive capsulitis of right shoulder: Secondary | ICD-10-CM | POA: Diagnosis not present

## 2018-12-18 DIAGNOSIS — M7502 Adhesive capsulitis of left shoulder: Secondary | ICD-10-CM | POA: Diagnosis not present

## 2018-12-22 DIAGNOSIS — M545 Low back pain: Secondary | ICD-10-CM | POA: Diagnosis not present

## 2018-12-22 DIAGNOSIS — M7502 Adhesive capsulitis of left shoulder: Secondary | ICD-10-CM | POA: Diagnosis not present

## 2018-12-22 DIAGNOSIS — M7501 Adhesive capsulitis of right shoulder: Secondary | ICD-10-CM | POA: Diagnosis not present

## 2018-12-22 DIAGNOSIS — M542 Cervicalgia: Secondary | ICD-10-CM | POA: Diagnosis not present

## 2018-12-25 DIAGNOSIS — M7501 Adhesive capsulitis of right shoulder: Secondary | ICD-10-CM | POA: Diagnosis not present

## 2018-12-25 DIAGNOSIS — M542 Cervicalgia: Secondary | ICD-10-CM | POA: Diagnosis not present

## 2018-12-25 DIAGNOSIS — M545 Low back pain: Secondary | ICD-10-CM | POA: Diagnosis not present

## 2018-12-25 DIAGNOSIS — M7502 Adhesive capsulitis of left shoulder: Secondary | ICD-10-CM | POA: Diagnosis not present

## 2018-12-29 DIAGNOSIS — M542 Cervicalgia: Secondary | ICD-10-CM | POA: Diagnosis not present

## 2018-12-29 DIAGNOSIS — M545 Low back pain: Secondary | ICD-10-CM | POA: Diagnosis not present

## 2018-12-29 DIAGNOSIS — M7502 Adhesive capsulitis of left shoulder: Secondary | ICD-10-CM | POA: Diagnosis not present

## 2018-12-29 DIAGNOSIS — M7501 Adhesive capsulitis of right shoulder: Secondary | ICD-10-CM | POA: Diagnosis not present

## 2018-12-31 ENCOUNTER — Other Ambulatory Visit: Payer: Self-pay | Admitting: Cardiovascular Disease

## 2019-01-01 DIAGNOSIS — M7501 Adhesive capsulitis of right shoulder: Secondary | ICD-10-CM | POA: Diagnosis not present

## 2019-01-01 DIAGNOSIS — M7502 Adhesive capsulitis of left shoulder: Secondary | ICD-10-CM | POA: Diagnosis not present

## 2019-01-01 DIAGNOSIS — M542 Cervicalgia: Secondary | ICD-10-CM | POA: Diagnosis not present

## 2019-01-01 DIAGNOSIS — M545 Low back pain: Secondary | ICD-10-CM | POA: Diagnosis not present

## 2019-01-05 DIAGNOSIS — M7501 Adhesive capsulitis of right shoulder: Secondary | ICD-10-CM | POA: Diagnosis not present

## 2019-01-05 DIAGNOSIS — M545 Low back pain: Secondary | ICD-10-CM | POA: Diagnosis not present

## 2019-01-05 DIAGNOSIS — M542 Cervicalgia: Secondary | ICD-10-CM | POA: Diagnosis not present

## 2019-01-05 DIAGNOSIS — M7502 Adhesive capsulitis of left shoulder: Secondary | ICD-10-CM | POA: Diagnosis not present

## 2019-01-28 DIAGNOSIS — N184 Chronic kidney disease, stage 4 (severe): Secondary | ICD-10-CM | POA: Diagnosis not present

## 2019-01-28 DIAGNOSIS — I10 Essential (primary) hypertension: Secondary | ICD-10-CM | POA: Diagnosis not present

## 2019-01-28 DIAGNOSIS — E1122 Type 2 diabetes mellitus with diabetic chronic kidney disease: Secondary | ICD-10-CM | POA: Diagnosis not present

## 2019-01-28 DIAGNOSIS — E782 Mixed hyperlipidemia: Secondary | ICD-10-CM | POA: Diagnosis not present

## 2019-01-28 DIAGNOSIS — Z0000 Encounter for general adult medical examination without abnormal findings: Secondary | ICD-10-CM | POA: Diagnosis not present

## 2019-02-09 DIAGNOSIS — Z1211 Encounter for screening for malignant neoplasm of colon: Secondary | ICD-10-CM | POA: Diagnosis not present

## 2019-02-17 DIAGNOSIS — Z01818 Encounter for other preprocedural examination: Secondary | ICD-10-CM | POA: Diagnosis not present

## 2019-02-19 DIAGNOSIS — K644 Residual hemorrhoidal skin tags: Secondary | ICD-10-CM | POA: Diagnosis not present

## 2019-02-19 DIAGNOSIS — K219 Gastro-esophageal reflux disease without esophagitis: Secondary | ICD-10-CM | POA: Diagnosis not present

## 2019-02-19 DIAGNOSIS — I129 Hypertensive chronic kidney disease with stage 1 through stage 4 chronic kidney disease, or unspecified chronic kidney disease: Secondary | ICD-10-CM | POA: Diagnosis not present

## 2019-02-19 DIAGNOSIS — N184 Chronic kidney disease, stage 4 (severe): Secondary | ICD-10-CM | POA: Diagnosis not present

## 2019-02-19 DIAGNOSIS — E7800 Pure hypercholesterolemia, unspecified: Secondary | ICD-10-CM | POA: Diagnosis not present

## 2019-02-19 DIAGNOSIS — K625 Hemorrhage of anus and rectum: Secondary | ICD-10-CM | POA: Diagnosis not present

## 2019-02-19 DIAGNOSIS — E1122 Type 2 diabetes mellitus with diabetic chronic kidney disease: Secondary | ICD-10-CM | POA: Diagnosis not present

## 2019-02-19 DIAGNOSIS — K5730 Diverticulosis of large intestine without perforation or abscess without bleeding: Secondary | ICD-10-CM | POA: Diagnosis not present

## 2019-02-19 DIAGNOSIS — K640 First degree hemorrhoids: Secondary | ICD-10-CM | POA: Diagnosis not present

## 2019-02-26 DIAGNOSIS — J45909 Unspecified asthma, uncomplicated: Secondary | ICD-10-CM | POA: Diagnosis not present

## 2019-03-03 DIAGNOSIS — E559 Vitamin D deficiency, unspecified: Secondary | ICD-10-CM | POA: Diagnosis not present

## 2019-03-03 DIAGNOSIS — Z79899 Other long term (current) drug therapy: Secondary | ICD-10-CM | POA: Diagnosis not present

## 2019-03-03 DIAGNOSIS — R809 Proteinuria, unspecified: Secondary | ICD-10-CM | POA: Diagnosis not present

## 2019-03-03 DIAGNOSIS — N1831 Chronic kidney disease, stage 3a: Secondary | ICD-10-CM | POA: Diagnosis not present

## 2019-03-03 DIAGNOSIS — D631 Anemia in chronic kidney disease: Secondary | ICD-10-CM | POA: Diagnosis not present

## 2019-03-05 DIAGNOSIS — N185 Chronic kidney disease, stage 5: Secondary | ICD-10-CM | POA: Diagnosis not present

## 2019-03-05 DIAGNOSIS — E559 Vitamin D deficiency, unspecified: Secondary | ICD-10-CM | POA: Diagnosis not present

## 2019-03-05 DIAGNOSIS — I129 Hypertensive chronic kidney disease with stage 1 through stage 4 chronic kidney disease, or unspecified chronic kidney disease: Secondary | ICD-10-CM | POA: Diagnosis not present

## 2019-03-05 DIAGNOSIS — R809 Proteinuria, unspecified: Secondary | ICD-10-CM | POA: Diagnosis not present

## 2019-03-05 DIAGNOSIS — E211 Secondary hyperparathyroidism, not elsewhere classified: Secondary | ICD-10-CM | POA: Diagnosis not present

## 2019-04-09 ENCOUNTER — Other Ambulatory Visit: Payer: Self-pay

## 2019-04-09 DIAGNOSIS — N184 Chronic kidney disease, stage 4 (severe): Secondary | ICD-10-CM

## 2019-04-10 ENCOUNTER — Ambulatory Visit (INDEPENDENT_AMBULATORY_CARE_PROVIDER_SITE_OTHER): Payer: Medicare HMO | Admitting: Vascular Surgery

## 2019-04-10 ENCOUNTER — Encounter: Payer: Self-pay | Admitting: Vascular Surgery

## 2019-04-10 ENCOUNTER — Other Ambulatory Visit: Payer: Self-pay

## 2019-04-10 ENCOUNTER — Ambulatory Visit (INDEPENDENT_AMBULATORY_CARE_PROVIDER_SITE_OTHER)
Admission: RE | Admit: 2019-04-10 | Discharge: 2019-04-10 | Disposition: A | Discharge status: Home / Self Care | Payer: Medicare HMO | Source: Ambulatory Visit | Attending: Surgery | Admitting: Surgery

## 2019-04-10 ENCOUNTER — Ambulatory Visit (HOSPITAL_COMMUNITY)
Admission: RE | Admit: 2019-04-10 | Discharge: 2019-04-10 | Disposition: A | Discharge status: Home / Self Care | Payer: Medicare HMO | Source: Ambulatory Visit | Attending: Surgery | Admitting: Surgery

## 2019-04-10 VITALS — BP 112/76 | HR 59 | Temp 97.6°F | Resp 20 | Ht 67.0 in | Wt 162.1 lb

## 2019-04-10 DIAGNOSIS — N184 Chronic kidney disease, stage 4 (severe): Secondary | ICD-10-CM

## 2019-04-10 NOTE — Progress Notes (Signed)
Patient ID: Kerri Brown, female   DOB: 16-Mar-1946, 74 y.o.   MRN: [id number]  Reason for Consult: New Patient (Initial Visit)   Referred by Lavella Lemons, PA  Subjective:     HPI:  Kerri Brown is a 74 y.o. female sent for evaluation for dialysis access.  She has never been on dialysis before.  She does not have any previous chest, breast, upper extremity surgery although she possibly fractured her left arm in the remote past.  She is right-hand dominant.  Past Medical History:  Diagnosis Date  . Anemia   . CKD (chronic kidney disease)   . Diabetes ([place])    stage 4  . Hyperlipemia   . Hypertension    Family History  Problem Relation Age of Onset  . Heart disease Father        had a pacemaker  . Diabetes Father   . Diabetes Sister   . Diabetes Sister   . Diabetes Other    Past Surgical History:  Procedure Laterality Date  . KNEE ARTHROSCOPY Right   . LAPAROSCOPIC CHOLECYSTECTOMY    . TOTAL HIP ARTHROPLASTY Right     Short Social History:  Social History   Tobacco Use  . Smoking status: Former Smoker    Packs/day: 0.50    Years: 44.00    Pack years: 22.00    Types: Cigarettes    Start date: 05/04/1970    Quit date: 05/04/2014    Years since quitting: 4.9  . Smokeless tobacco: Never Used  Substance Use Topics  . Alcohol use: Never    Alcohol/week: 0.0 standard drinks    No Known Allergies  Current Outpatient Medications  Medication Sig Dispense Refill  . allopurinol (ZYLOPRIM) 100 MG tablet Take 1 tablet by mouth daily.    . benazepril (LOTENSIN) 40 MG tablet Take 1 tablet (40 mg total) by mouth daily. 90 tablet 2  . chlorthalidone (HYGROTON) 25 MG tablet Take by mouth.    . cholecalciferol (VITAMIN D) 1000 units tablet Take 1,000 Units by mouth daily.    . cyclobenzaprine (FLEXERIL) 10 MG tablet TAKE 1 TABLET BY MOUTH EVERY 8 HOURS AS NEEDED FOR MUSCLE SPASM    . furosemide (LASIX) 40 MG tablet Take by mouth.    Marland Kitchen LANTUS SOLOSTAR 100 UNIT/ML  Solostar Pen Inject 42 Units into the skin daily.    . meloxicam (MOBIC) 15 MG tablet Take 15 mg by mouth daily.    . metoprolol tartrate (LOPRESSOR) 25 MG tablet Take 1 tablet by mouth 2 (two) times daily.    Marland Kitchen omeprazole (PRILOSEC) 20 MG capsule Take 1 capsule by mouth once daily 90 capsule 1  . pravastatin (PRAVACHOL) 40 MG tablet Take 1 tablet by mouth daily.    . Rivaroxaban (XARELTO) 15 MG TABS tablet Take 1 tablet (15 mg total) by mouth daily with supper. 49 tablet 0  . rosuvastatin (CRESTOR) 10 MG tablet Take by mouth.     No current facility-administered medications for this visit.    Review of Systems  Constitutional:  Constitutional negative. HENT: HENT negative.  Eyes: Eyes negative.  Respiratory: Respiratory negative.  Cardiovascular: Cardiovascular negative.  GI: Gastrointestinal negative.  Skin: Skin negative.  Neurological: Neurological negative. Hematologic: Hematologic/lymphatic negative.  Psychiatric: Psychiatric negative.        Objective:  Objective   Vitals:   04/10/19 1143  BP: 112/76  Pulse: (!) 59  Resp: 20  Temp: 97.6 F (36.4 C)  SpO2: 98%  Weight: 162 lb 1.6 oz (73.5 kg)  Height: 5\' 7"  (1.702 m)   Body mass index is 25.39 kg/m.  Physical Exam HENT:     Head: Normocephalic.     Nose:     Comments: Mask in place Eyes:     Extraocular Movements: Extraocular movements intact.     Pupils: Pupils are equal, round, and reactive to light.  Cardiovascular:     Rate and Rhythm: Normal rate.     Pulses:          Radial pulses are 2+ on the right side and 2+ on the left side.  Pulmonary:     Effort: Pulmonary effort is normal.  Abdominal:     General: Abdomen is flat.     Palpations: Abdomen is soft.  Musculoskeletal:        General: No swelling. Normal range of motion.  Skin:    General: Skin is warm and dry.     Capillary Refill: Capillary refill takes less than 2 seconds.  Neurological:     General: No focal deficit present.      Mental Status: She is alert.  Psychiatric:        Mood and Affect: Mood normal.        Thought Content: Thought content normal.        Judgment: Judgment normal.     Data: I have independently interpreted her bilateral upper extremity arterial duplex which demonstrate triphasic waveforms bilaterally.  Brachial artery on the right 0.56 cm on the left 0.46 cm.  I have also independently interpreted her bilateral upper extremity venous duplex which demonstrates right-sided basilic vein above the antecubitum and left-sided basilic vein below the antecubitum as possible suitable options for dialysis access.      Assessment/Plan:     74 year old female with chronic kidney disease stage V now in need of dialysis access.  She is right-hand dominant.  We will begin with left upper extremity fistula likely for stage basilic vein fistula versus graft.  I discussed the options for dialysis access including catheter, fistula, graft.  We have also discussed the risk benefits and alternatives and she demonstrates good understanding we will get her scheduled today.  Xarelto to be held for 48 hours prior to surgery.     Kerri Sandy MD [facility]

## 2019-04-11 DIAGNOSIS — I998 Other disorder of circulatory system: Secondary | ICD-10-CM | POA: Diagnosis not present

## 2019-04-11 DIAGNOSIS — R001 Bradycardia, unspecified: Secondary | ICD-10-CM | POA: Diagnosis not present

## 2019-04-11 DIAGNOSIS — E119 Type 2 diabetes mellitus without complications: Secondary | ICD-10-CM | POA: Diagnosis not present

## 2019-04-11 DIAGNOSIS — M79601 Pain in right arm: Secondary | ICD-10-CM | POA: Diagnosis not present

## 2019-04-11 DIAGNOSIS — M62241 Nontraumatic ischemic infarction of muscle, right hand: Secondary | ICD-10-CM | POA: Diagnosis not present

## 2019-04-11 DIAGNOSIS — F17210 Nicotine dependence, cigarettes, uncomplicated: Secondary | ICD-10-CM | POA: Diagnosis not present

## 2019-04-11 DIAGNOSIS — Z20822 Contact with and (suspected) exposure to covid-19: Secondary | ICD-10-CM | POA: Diagnosis not present

## 2019-04-11 DIAGNOSIS — I739 Peripheral vascular disease, unspecified: Secondary | ICD-10-CM | POA: Diagnosis not present

## 2019-04-11 DIAGNOSIS — E785 Hyperlipidemia, unspecified: Secondary | ICD-10-CM | POA: Diagnosis not present

## 2019-04-11 DIAGNOSIS — R52 Pain, unspecified: Secondary | ICD-10-CM | POA: Diagnosis not present

## 2019-04-11 DIAGNOSIS — Z7901 Long term (current) use of anticoagulants: Secondary | ICD-10-CM | POA: Diagnosis not present

## 2019-04-11 DIAGNOSIS — Z7902 Long term (current) use of antithrombotics/antiplatelets: Secondary | ICD-10-CM | POA: Diagnosis not present

## 2019-04-11 DIAGNOSIS — G459 Transient cerebral ischemic attack, unspecified: Secondary | ICD-10-CM | POA: Diagnosis not present

## 2019-04-11 DIAGNOSIS — E1122 Type 2 diabetes mellitus with diabetic chronic kidney disease: Secondary | ICD-10-CM | POA: Diagnosis not present

## 2019-04-11 DIAGNOSIS — Z9114 Patient's other noncompliance with medication regimen: Secondary | ICD-10-CM | POA: Diagnosis not present

## 2019-04-11 DIAGNOSIS — Z79899 Other long term (current) drug therapy: Secondary | ICD-10-CM | POA: Diagnosis not present

## 2019-04-11 DIAGNOSIS — I129 Hypertensive chronic kidney disease with stage 1 through stage 4 chronic kidney disease, or unspecified chronic kidney disease: Secondary | ICD-10-CM | POA: Diagnosis not present

## 2019-04-11 DIAGNOSIS — I10 Essential (primary) hypertension: Secondary | ICD-10-CM | POA: Diagnosis not present

## 2019-04-11 DIAGNOSIS — Z794 Long term (current) use of insulin: Secondary | ICD-10-CM | POA: Diagnosis not present

## 2019-04-11 DIAGNOSIS — N189 Chronic kidney disease, unspecified: Secondary | ICD-10-CM | POA: Diagnosis not present

## 2019-04-11 DIAGNOSIS — I499 Cardiac arrhythmia, unspecified: Secondary | ICD-10-CM | POA: Diagnosis not present

## 2019-04-11 DIAGNOSIS — I742 Embolism and thrombosis of arteries of the upper extremities: Secondary | ICD-10-CM | POA: Diagnosis not present

## 2019-04-11 DIAGNOSIS — R202 Paresthesia of skin: Secondary | ICD-10-CM | POA: Diagnosis not present

## 2019-04-11 DIAGNOSIS — T796XXA Traumatic ischemia of muscle, initial encounter: Secondary | ICD-10-CM | POA: Diagnosis not present

## 2019-04-11 DIAGNOSIS — I4891 Unspecified atrial fibrillation: Secondary | ICD-10-CM | POA: Diagnosis not present

## 2019-04-11 DIAGNOSIS — M25519 Pain in unspecified shoulder: Secondary | ICD-10-CM | POA: Diagnosis not present

## 2019-04-11 DIAGNOSIS — M79641 Pain in right hand: Secondary | ICD-10-CM | POA: Diagnosis not present

## 2019-04-11 DIAGNOSIS — R0902 Hypoxemia: Secondary | ICD-10-CM | POA: Diagnosis not present

## 2019-04-11 HISTORY — PX: BRACHIAL EMBOLECTOMY: SHX1259

## 2019-04-11 HISTORY — DX: Embolism and thrombosis of arteries of the upper extremities: I74.2

## 2019-04-17 ENCOUNTER — Telehealth: Payer: Self-pay | Admitting: Cardiovascular Disease

## 2019-04-17 NOTE — Telephone Encounter (Signed)
Dr. Pleas Koch called me regarding this patient's anticoagulation management.  She was apparently hospitalized at [facility] for thromboembolic disease in the upper extremities.  I do not have records available at the time of this note.  She underwent successful vascular surgery.  She was apparently not taking her Xarelto at the time of this incident.  Her chronic kidney disease has progressed.  He asked about switching her to warfarin which I think is prudent.  I will enroll her in our anticoagulation clinic for routine monitoring of her INR.

## 2019-04-20 ENCOUNTER — Other Ambulatory Visit: Payer: Self-pay

## 2019-04-20 ENCOUNTER — Encounter (HOSPITAL_COMMUNITY): Payer: Self-pay | Admitting: Vascular Surgery

## 2019-04-20 ENCOUNTER — Telehealth: Payer: Self-pay | Admitting: *Deleted

## 2019-04-20 ENCOUNTER — Ambulatory Visit (INDEPENDENT_AMBULATORY_CARE_PROVIDER_SITE_OTHER): Payer: Medicare HMO | Admitting: *Deleted

## 2019-04-20 DIAGNOSIS — Z5181 Encounter for therapeutic drug level monitoring: Secondary | ICD-10-CM | POA: Diagnosis not present

## 2019-04-20 DIAGNOSIS — I4819 Other persistent atrial fibrillation: Secondary | ICD-10-CM

## 2019-04-20 DIAGNOSIS — I4891 Unspecified atrial fibrillation: Secondary | ICD-10-CM | POA: Insufficient documentation

## 2019-04-20 LAB — POCT INR: INR: 1.3 — AB (ref 2.0–3.0)

## 2019-04-20 MED ORDER — WARFARIN SODIUM 5 MG PO TABS: 5.0000 mg | ORAL_TABLET | Freq: Every day | ORAL | 1 refills | Status: AC

## 2019-04-20 NOTE — Progress Notes (Signed)
Spoke with Kerri Brown for pre-op call. Kerri Brown has hx of A-fib and is on Xarelto. Dr. Jacinta Shoe is her cardiologist. Kerri Brown has been instructed to hold Xarelto for 48 hours prior to surgery. Her last dose will be this evening, 04/20/19. Kerri Brown stated that she saw a nurse today about starting Warfarin after her surgery. She had thrombectomy on her right arm last week. She states that her arm is healing well, no problems since the surgery. Kerri Brown is a type 2 diabetic. States her fasting blood sugar has been around 170 recently and had a 300 one evening recently. Last A1C was 7.4 on 04/12/19. Instructed Kerri Brown to take 1/2 of her regular dose of Lantus Insulin Thursday AM. She will take 22 units. Instructed Kerri Brown to check her blood sugar when she gets up Thursday AM and every 2 hours until she leaves for the hospital. If blood sugar is 70 or below, treat with 1/2 cup of clear juice (apple or cranberry) and recheck blood sugar 15 minutes after drinking juice. If blood sugar continues to be 70 or below, call the Short Stay department and ask to speak to a nurse. Kerri Brown voiced understanding.   Kerri Brown will have her Covid test done tomorrow. Kerri Brown given quarantine instructions and voiced understanding.   Kerri Brown made aware that she will need a responsible adult to drive her home and be with her for 24 hours after surgery. She states she has a driver, but didn't know about the 24 hours. She states her man friend will probably be able to stay with her.

## 2019-04-20 NOTE — Anesthesia Preprocedure Evaluation (Addendum)
Anesthesia Evaluation  Patient identified by MRN, date of birth, ID band Patient awake    Reviewed: Allergy & Precautions, NPO status , Patient's Chart, lab work & pertinent test results  History of Anesthesia Complications Negative for: history of anesthetic complications  Airway Mallampati: II  TM Distance: >3 FB     Dental no notable dental hx. (+) Dental Advisory Given   Pulmonary neg pulmonary ROS, former smoker,    Pulmonary exam normal        Cardiovascular hypertension, Normal cardiovascular exam  CV: Echo 05/26/15: Study Conclusions  - Left ventricle: The cavity size was normal. Wall thickness was  increased in a pattern of mild LVH. Systolic function was normal.  The estimated ejection fraction was in the range of 60% to 65%.  Wall motion was normal; there were no regional wall motion  abnormalities. Features are consistent with a pseudonormal left  ventricular filling pattern, with concomitant abnormal relaxation  and increased filling pressure (grade 2 diastolic dysfunction).  - Mitral valve: There was trivial regurgitation.  - Left atrium: The atrium was mildly dilated.  - Right atrium: Central venous pressure (est): 3 mm Hg.  - Atrial septum: The septum was thickened.  - Tricuspid valve: There was trivial regurgitation.  - Pulmonary arteries: PA peak pressure: 15 mm Hg (S).  - Pericardium, extracardiac: There was no pericardial effusion.  Impressions:  - Mild LVH with LVEF 60-65%. Grade 2 diastolic dysfunction. Mild  left atrial enlargement. Trivial mitral and tricuspid  regurgitation. Thickened interatrial septum. PASP estimated 15  mmHg.    Cardiac event monitor 04/15/15-05/05/15:  Study Highlights Sinus rhythm and paroxysmal rapid atrial fibrillation.  Asymptomatic.   Neuro/Psych negative neurological ROS     GI/Hepatic Neg liver ROS, GERD  ,  Endo/Other  diabetes   Renal/GU Renal disease     Musculoskeletal negative musculoskeletal ROS (+)   Abdominal   Peds  Hematology negative hematology ROS (+)   Anesthesia Other Findings Day of surgery medications reviewed with the patient.  Reproductive/Obstetrics                            Anesthesia Physical Anesthesia Plan  ASA: III  Anesthesia Plan: MAC   Post-op Pain Management:    Induction:   PONV Risk Score and Plan: 2 and Ondansetron and Propofol infusion  Airway Management Planned: Natural Airway  Additional Equipment:   Intra-op Plan:   Post-operative Plan:   Informed Consent: I have reviewed the patients History and Physical, chart, labs and discussed the procedure including the risks, benefits and alternatives for the proposed anesthesia with the patient or authorized representative who has indicated his/her understanding and acceptance.     Dental advisory given  Plan Discussed with: Anesthesiologist and CRNA  Anesthesia Plan Comments: (See PAT note written by Myra Gianotti, PA-C. )      Anesthesia Quick Evaluation

## 2019-04-20 NOTE — Progress Notes (Signed)
Anesthesia Chart Review: Kerri Brown   Case: [id number] Date/Time: 04/23/19 0846   Procedure: ARTERIOVENOUS (AV) FISTULA CREATION VERSES ARTERIOVENOUS GRAFT (Left )   Anesthesia type: Monitor Anesthesia Care   Pre-op diagnosis: CHRONIC KIDNEY DISEASE   Location: [facility] OR ROOM 11 / [place] [facility]   Surgeons: Waynetta Sandy, MD      DISCUSSION: Patient is a 74 year old female scheduled for the above procedure.  History includes former smoker (quit 05/04/14), PAF, DM2 (on insulin), CKD (stage IV), anemia, HTN, HLD.  - Hospitalized at [facility] (after transfer from [facility]) 04/11/19-04/13/19 for ischemic right hand is setting of PAF with Xarelto non-compliance due to cost, s/p thromboembolectomy of right arm/brachial artery with angiogram by Janet Berlin, MD. Contrast was "significantly" limited due to CKD and need for upcoming hemodialysis access.  Right brachial embolectomy occurred just after she saw Dr. Donzetta Matters, so I did communicate these events with him. In the interim, she has also been in communication with cardiologist Dr. Bronson Ing and his staff. Xarelto is expensive for her, so plans are to transition her to warfarin. However, given plans for surgery she will remain on Xarelto for now, hold 48 hours prior to surgery as previously instructed and initiate warfarin after procedure with plan for Lovenox overlap.  According to 04/20/19 telephone encounter by Edrick Oh, RN, this plan was discussed with Dr. Bronson Ing.  I also updated Dr. Donzetta Matters.  She is for pre-operative COVID-19 test on 04/21/19 and for same day labs. Anesthesia to evaluate on the day of surgery.    VS: As of 04/10/19, BP 112/76, HR 59.   PROVIDERS: Curlene Labrum, MD is PCP ([facility]) Kate Sable, MD is cardiologist. Last visit 11/04/18. PAF symptomatically stable on metoprolol. Considering switching from Xarelto to warfarin due to cost. Ulice Bold, MD is nephrologist  ([facility])   LABS: She is for labs on arrival. As of 04/12/19 (see [facility]), A1c 7.4%, Cr 2.87, BUN 53, ALT < 6, AST 23. CBC on 03/03/19 ([facility]) showed WBC 11.4, H/H 12.3/38.9, PLT 218.     EKG: - EKG 11/04/18 ([facility]): Sinus bradycardia with marked sinus arrhythmia Right bundle branch block Left anterior fascicular block Bifascicular block - Bifascicular block also noted on prior tracing from 05/28/17   CV: Echo 05/26/15: Study Conclusions  - Left ventricle: The cavity size was normal. Wall thickness was  increased in a pattern of mild LVH. Systolic function was normal.  The estimated ejection fraction was in the range of 60% to 65%.  Wall motion was normal; there were no regional wall motion  abnormalities. Features are consistent with a pseudonormal left  ventricular filling pattern, with concomitant abnormal relaxation  and increased filling pressure (grade 2 diastolic dysfunction).  - Mitral valve: There was trivial regurgitation.  - Left atrium: The atrium was mildly dilated.  - Right atrium: Central venous pressure (est): 3 mm Hg.  - Atrial septum: The septum was thickened.  - Tricuspid valve: There was trivial regurgitation.  - Pulmonary arteries: PA peak pressure: 15 mm Hg (S).  - Pericardium, extracardiac: There was no pericardial effusion.  Impressions:  - Mild LVH with LVEF 60-65%. Grade 2 diastolic dysfunction. Mild  left atrial enlargement. Trivial mitral and tricuspid  regurgitation. Thickened interatrial septum. PASP estimated 15  mmHg.    Cardiac event monitor 04/15/15-05/05/15:  Study Highlights Sinus rhythm and paroxysmal rapid atrial fibrillation.  Asymptomatic.    Past Medical History:  Diagnosis Date  .  Anemia   . CKD (chronic kidney disease)    Stage IV  . Diabetes ([place])   . Hyperlipemia   . Hypertension   . PAF (paroxysmal atrial fibrillation) ([place])   . 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 KNEE ARTHROSCOPY Right   . LAPAROSCOPIC CHOLECYSTECTOMY    . TOTAL HIP ARTHROPLASTY Right     MEDICATIONS: No current facility-administered medications for this encounter.   Marland Kitchen acetaminophen (TYLENOL) 500 MG tablet  . allopurinol (ZYLOPRIM) 100 MG tablet  . cholecalciferol (VITAMIN D) 1000 units tablet  . Ferrous Gluconate (IRON) 240 (27 Fe) MG TABS  . furosemide (LASIX) 40 MG tablet  . LANTUS SOLOSTAR 100 UNIT/ML Solostar Pen  . metoprolol tartrate (LOPRESSOR) 25 MG tablet  . omeprazole (PRILOSEC) 20 MG capsule  . Rivaroxaban (XARELTO) 15 MG TABS tablet  . rosuvastatin (CRESTOR) 10 MG tablet  . sodium bicarbonate 650 MG tablet  . benazepril (LOTENSIN) 40 MG tablet  . rivaroxaban (XARELTO) 20 MG TABS tablet    Myra Gianotti, PA-C Surgical Short Stay/Anesthesiology [facility] Phone [phone] [facility] Phone [phone] 04/20/2019 3:37 PM

## 2019-04-20 NOTE — Patient Instructions (Signed)
Here to change from Xarelto to warfarin (CrCl 20.50).  Currently on 4 week Xarelto DVT protocol. Scheduled for AV fistula creation 04/23/19.  Surgeons told pt to hold Xarelto 48 hrs before procedure. OK'd with Dr Raliegh Ip.  Pt to take last dose of Xarelto tonight.  Will resume xarelto 15mg  bid and warfarin 5mg  daily the night of procedure if ok with surgeon.  Will overlap warfarin and Xarelto x 3 days per protocol then stop Xarelto and continue warfarin.  Will check INR on Monday 04/27/19.  Pt verbalized understanding.

## 2019-04-20 NOTE — Telephone Encounter (Signed)
-----   Message from Leeroy Bock, [facility] sent at 04/20/2019  1:13 PM EST ----- Then would have to do 3 day overlap with Xarelto but would follow up very closely to make sure her INR becomes therapeutic as soon as possible ----- Message ----- From: Malen Gauze, RN Sent: 04/20/2019   1:04 PM EST To: Leeroy Bock, [place]  Surgery was on 04/11/19.  Will check with Dr Raliegh Ip.  If she can't afford Lovenox?? ----- Message ----- From: Leeroy Bock, [facility] Sent: 04/20/2019  12:41 PM EST To: Malen Gauze, RN  Do you know when the clot was diagnosed? I am assuming within the past month - if that's the case, would defer to Dr Raliegh Ip to see if he is ok with pt holding Xarelto for fistula placement in setting of active clot - agree we would want to minimize time off anticoag as much as possible. With an active clot, it would be better to bridge with Lovenox/warfarin instead of our normal 3 day DOAC overlap.  Thanks, Jinny Blossom ----- Message ----- From: Malen Gauze, RN Sent: 04/20/2019  11:45 AM EST To: Leeroy Bock, RPH  Morning Megan.  Need your help with this Hot Mess.  Admitted to [facility] then [facility] for clot Rt upper extremity with atrerial thrombectomy.  Was previously on Xarelto but not always complaint due to cost.  She was d/c on Xarelto DVT protocol 4 week pack.  Dr Pleas Koch spoke to Dr Raliegh Ip last week and recommended changing to Warfarin. ( I assume due to elevated SCr)  Last SCr on 1/26 was 2.87.  It was 3.38 on 1/24.  CrCl is 20.50.  She is scheduled for AV fistual creation for dialysis on 2/4.  (Not sure when this was scheduled)  Was told by Nephrology to take last dose of Xarelto on 2/1.   1.  Should anticoagulation be interrupted yet for fistula placement if not emergent?  2.  If so, would you start her back on Xarelto after procedure and overlap with warfarin x 3 days then d/c Xarelto and continue with warfarin?  Hope this helps. Records in Care Everywhere  Thanks, [name]

## 2019-04-20 NOTE — Telephone Encounter (Signed)
-----   Message from Herminio Commons, MD sent at 04/20/2019  1:46 PM EST ----- That is fine. ----- Message ----- From: Malen Gauze, RN Sent: 04/20/2019   1:23 PM EST To: Herminio Commons, MD  Saw pt today.  She is scheduled for AV fistula creation on 2/4.  Are you OK with her holding Xarelto for procedure in light of recent thrombectomy?  Will initiate warfarin after procedure with DOAC/lovenox overlap.  Thanks, [name]  ----- Message ----- From: Herminio Commons, MD Sent: 04/17/2019   1:29 PM EST To: Malen Gauze, RN, Drema Dallas, CMA  Please start patient on warfarin for history of thrombosis and atrial fibrillation.  Please enroll in anticoagulation clinic for routine INR monitoring.

## 2019-04-21 ENCOUNTER — Other Ambulatory Visit (HOSPITAL_COMMUNITY)
Admission: RE | Admit: 2019-04-21 | Discharge: 2019-04-21 | Disposition: A | Discharge status: Home / Self Care | Payer: Medicare HMO | Source: Ambulatory Visit | Attending: Vascular Surgery | Admitting: Vascular Surgery

## 2019-04-21 DIAGNOSIS — I129 Hypertensive chronic kidney disease with stage 1 through stage 4 chronic kidney disease, or unspecified chronic kidney disease: Secondary | ICD-10-CM | POA: Diagnosis not present

## 2019-04-21 DIAGNOSIS — Z01812 Encounter for preprocedural laboratory examination: Secondary | ICD-10-CM | POA: Insufficient documentation

## 2019-04-21 DIAGNOSIS — E1122 Type 2 diabetes mellitus with diabetic chronic kidney disease: Secondary | ICD-10-CM | POA: Diagnosis not present

## 2019-04-21 DIAGNOSIS — E1129 Type 2 diabetes mellitus with other diabetic kidney complication: Secondary | ICD-10-CM | POA: Diagnosis not present

## 2019-04-21 DIAGNOSIS — E559 Vitamin D deficiency, unspecified: Secondary | ICD-10-CM | POA: Diagnosis not present

## 2019-04-21 DIAGNOSIS — N185 Chronic kidney disease, stage 5: Secondary | ICD-10-CM | POA: Diagnosis not present

## 2019-04-21 DIAGNOSIS — E872 Acidosis: Secondary | ICD-10-CM | POA: Diagnosis not present

## 2019-04-21 DIAGNOSIS — Z20822 Contact with and (suspected) exposure to covid-19: Secondary | ICD-10-CM | POA: Diagnosis not present

## 2019-04-21 DIAGNOSIS — R809 Proteinuria, unspecified: Secondary | ICD-10-CM | POA: Diagnosis not present

## 2019-04-21 DIAGNOSIS — E211 Secondary hyperparathyroidism, not elsewhere classified: Secondary | ICD-10-CM | POA: Diagnosis not present

## 2019-04-21 LAB — SARS CORONAVIRUS 2 (TAT 6-24 HRS): SARS Coronavirus 2: NEGATIVE

## 2019-04-23 ENCOUNTER — Other Ambulatory Visit: Payer: Self-pay

## 2019-04-23 ENCOUNTER — Encounter (HOSPITAL_COMMUNITY)
Admission: RE | Disposition: A | Discharge status: Home / Self Care | Payer: Self-pay | Source: Home / Self Care | Attending: Vascular Surgery

## 2019-04-23 ENCOUNTER — Ambulatory Visit (HOSPITAL_COMMUNITY): Payer: Medicare HMO | Admitting: Vascular Surgery

## 2019-04-23 ENCOUNTER — Encounter (HOSPITAL_COMMUNITY): Payer: Self-pay | Admitting: Vascular Surgery

## 2019-04-23 ENCOUNTER — Ambulatory Visit (HOSPITAL_COMMUNITY)
Admission: RE | Admit: 2019-04-23 | Discharge: 2019-04-23 | Disposition: A | Discharge status: Home / Self Care | Payer: Medicare HMO | Attending: Vascular Surgery | Admitting: Vascular Surgery

## 2019-04-23 DIAGNOSIS — E785 Hyperlipidemia, unspecified: Secondary | ICD-10-CM | POA: Diagnosis not present

## 2019-04-23 DIAGNOSIS — Z87891 Personal history of nicotine dependence: Secondary | ICD-10-CM | POA: Insufficient documentation

## 2019-04-23 DIAGNOSIS — Z794 Long term (current) use of insulin: Secondary | ICD-10-CM | POA: Insufficient documentation

## 2019-04-23 DIAGNOSIS — E1122 Type 2 diabetes mellitus with diabetic chronic kidney disease: Secondary | ICD-10-CM | POA: Insufficient documentation

## 2019-04-23 DIAGNOSIS — I480 Paroxysmal atrial fibrillation: Secondary | ICD-10-CM | POA: Diagnosis not present

## 2019-04-23 DIAGNOSIS — D649 Anemia, unspecified: Secondary | ICD-10-CM | POA: Diagnosis not present

## 2019-04-23 DIAGNOSIS — Z7901 Long term (current) use of anticoagulants: Secondary | ICD-10-CM | POA: Diagnosis not present

## 2019-04-23 DIAGNOSIS — I120 Hypertensive chronic kidney disease with stage 5 chronic kidney disease or end stage renal disease: Secondary | ICD-10-CM | POA: Diagnosis not present

## 2019-04-23 DIAGNOSIS — N185 Chronic kidney disease, stage 5: Secondary | ICD-10-CM | POA: Insufficient documentation

## 2019-04-23 DIAGNOSIS — Z79899 Other long term (current) drug therapy: Secondary | ICD-10-CM | POA: Insufficient documentation

## 2019-04-23 DIAGNOSIS — Z992 Dependence on renal dialysis: Secondary | ICD-10-CM | POA: Diagnosis not present

## 2019-04-23 DIAGNOSIS — N189 Chronic kidney disease, unspecified: Secondary | ICD-10-CM

## 2019-04-23 HISTORY — PX: AV FISTULA PLACEMENT: SHX1204

## 2019-04-23 HISTORY — DX: Constipation, unspecified: K59.00

## 2019-04-23 HISTORY — DX: Gastro-esophageal reflux disease without esophagitis: K21.9

## 2019-04-23 HISTORY — DX: Paroxysmal atrial fibrillation: I48.0

## 2019-04-23 HISTORY — DX: Unspecified osteoarthritis, unspecified site: M19.90

## 2019-04-23 LAB — POCT I-STAT, CHEM 8
BUN: 51 mg/dL — ABNORMAL HIGH (ref 8–23)
Calcium, Ion: 1.18 mmol/L (ref 1.15–1.40)
Chloride: 110 mmol/L (ref 98–111)
Creatinine, Ser: 3.3 mg/dL — ABNORMAL HIGH (ref 0.44–1.00)
Glucose, Bld: 148 mg/dL — ABNORMAL HIGH (ref 70–99)
HCT: 35 % — ABNORMAL LOW (ref 36.0–46.0)
Hemoglobin: 11.9 g/dL — ABNORMAL LOW (ref 12.0–15.0)
Potassium: 5.2 mmol/L — ABNORMAL HIGH (ref 3.5–5.1)
Sodium: 138 mmol/L (ref 135–145)
TCO2: 21 mmol/L — ABNORMAL LOW (ref 22–32)

## 2019-04-23 LAB — PROTIME-INR
INR: 1 (ref 0.8–1.2)
Prothrombin Time: 12.7 seconds (ref 11.4–15.2)

## 2019-04-23 LAB — GLUCOSE, CAPILLARY
Glucose-Capillary: 144 mg/dL — ABNORMAL HIGH (ref 70–99)
Glucose-Capillary: 152 mg/dL — ABNORMAL HIGH (ref 70–99)

## 2019-04-23 SURGERY — ARTERIOVENOUS (AV) FISTULA CREATION
Anesthesia: Monitor Anesthesia Care | Laterality: Left

## 2019-04-23 MED ORDER — PROMETHAZINE HCL 25 MG/ML IJ SOLN: 6.2500 mg | INTRAMUSCULAR | Status: DC | PRN

## 2019-04-23 MED ORDER — SODIUM CHLORIDE 0.9 % IV SOLN
INTRAVENOUS | Status: AC
  Filled 2019-04-23: qty 1.2

## 2019-04-23 MED ORDER — FENTANYL CITRATE (PF) 100 MCG/2ML IJ SOLN: 25.0000 ug | INTRAMUSCULAR | Status: DC | PRN

## 2019-04-23 MED ORDER — DEXAMETHASONE SODIUM PHOSPHATE 10 MG/ML IJ SOLN
INTRAMUSCULAR | Status: AC
  Filled 2019-04-23: qty 1

## 2019-04-23 MED ORDER — LIDOCAINE 2% (20 MG/ML) 5 ML SYRINGE
INTRAMUSCULAR | Status: AC
  Filled 2019-04-23: qty 5

## 2019-04-23 MED ORDER — SODIUM CHLORIDE 0.9 % IV SOLN
INTRAVENOUS | Status: DC | PRN
  Administered 2019-04-23: 500 mL

## 2019-04-23 MED ORDER — SODIUM CHLORIDE 0.9 % IV SOLN: INTRAVENOUS | Status: DC

## 2019-04-23 MED ORDER — ONDANSETRON HCL 4 MG/2ML IJ SOLN
INTRAMUSCULAR | Status: DC | PRN
  Administered 2019-04-23: 4 mg via INTRAVENOUS

## 2019-04-23 MED ORDER — PROPOFOL 10 MG/ML IV BOLUS
INTRAVENOUS | Status: AC
  Filled 2019-04-23: qty 20

## 2019-04-23 MED ORDER — FENTANYL CITRATE (PF) 100 MCG/2ML IJ SOLN
INTRAMUSCULAR | Status: DC | PRN
  Administered 2019-04-23: 25 ug via INTRAVENOUS

## 2019-04-23 MED ORDER — LIDOCAINE-EPINEPHRINE 1 %-1:100000 IJ SOLN
INTRAMUSCULAR | Status: DC | PRN
  Administered 2019-04-23: 6 mL

## 2019-04-23 MED ORDER — CEFAZOLIN SODIUM-DEXTROSE 2-4 GM/100ML-% IV SOLN
2.0000 g | INTRAVENOUS | Status: AC
  Administered 2019-04-23: 2 g via INTRAVENOUS
  Filled 2019-04-23: qty 100

## 2019-04-23 MED ORDER — PHENYLEPHRINE 40 MCG/ML (10ML) SYRINGE FOR IV PUSH (FOR BLOOD PRESSURE SUPPORT)
PREFILLED_SYRINGE | INTRAVENOUS | Status: AC
  Filled 2019-04-23: qty 10

## 2019-04-23 MED ORDER — PROPOFOL 500 MG/50ML IV EMUL
INTRAVENOUS | Status: DC | PRN
  Administered 2019-04-23: 75 ug/kg/min via INTRAVENOUS

## 2019-04-23 MED ORDER — OXYCODONE HCL 5 MG PO TABS: 5.0000 mg | ORAL_TABLET | Freq: Four times a day (QID) | ORAL | 0 refills | Status: DC | PRN

## 2019-04-23 MED ORDER — CHLORHEXIDINE GLUCONATE 4 % EX LIQD: 60.0000 mL | Freq: Once | CUTANEOUS | Status: DC

## 2019-04-23 MED ORDER — PROPOFOL 10 MG/ML IV BOLUS
INTRAVENOUS | Status: DC | PRN
  Administered 2019-04-23 (×2): 20 mg via INTRAVENOUS

## 2019-04-23 MED ORDER — PHENYLEPHRINE HCL-NACL 10-0.9 MG/250ML-% IV SOLN
INTRAVENOUS | Status: DC | PRN
  Administered 2019-04-23: 25 ug/min via INTRAVENOUS

## 2019-04-23 MED ORDER — LIDOCAINE 2% (20 MG/ML) 5 ML SYRINGE
INTRAMUSCULAR | Status: DC | PRN
  Administered 2019-04-23: 40 mg via INTRAVENOUS

## 2019-04-23 MED ORDER — LIDOCAINE-EPINEPHRINE 1 %-1:100000 IJ SOLN
INTRAMUSCULAR | Status: AC
  Filled 2019-04-23: qty 1

## 2019-04-23 MED ORDER — PHENYLEPHRINE 40 MCG/ML (10ML) SYRINGE FOR IV PUSH (FOR BLOOD PRESSURE SUPPORT)
PREFILLED_SYRINGE | INTRAVENOUS | Status: DC | PRN
  Administered 2019-04-23: 80 ug via INTRAVENOUS

## 2019-04-23 MED ORDER — CELECOXIB 200 MG PO CAPS
400.0000 mg | ORAL_CAPSULE | Freq: Once | ORAL | Status: AC
  Administered 2019-04-23: 400 mg via ORAL
  Filled 2019-04-23: qty 2

## 2019-04-23 MED ORDER — ACETAMINOPHEN 500 MG PO TABS
1000.0000 mg | ORAL_TABLET | Freq: Once | ORAL | Status: AC
  Administered 2019-04-23: 1000 mg via ORAL
  Filled 2019-04-23: qty 2

## 2019-04-23 MED ORDER — DEXAMETHASONE SODIUM PHOSPHATE 10 MG/ML IJ SOLN
INTRAMUSCULAR | Status: DC | PRN
  Administered 2019-04-23: 5 mg via INTRAVENOUS

## 2019-04-23 MED ORDER — ONDANSETRON HCL 4 MG/2ML IJ SOLN
INTRAMUSCULAR | Status: AC
  Filled 2019-04-23: qty 2

## 2019-04-23 MED ORDER — FENTANYL CITRATE (PF) 250 MCG/5ML IJ SOLN
INTRAMUSCULAR | Status: AC
  Filled 2019-04-23: qty 5

## 2019-04-23 MED ORDER — 0.9 % SODIUM CHLORIDE (POUR BTL) OPTIME
TOPICAL | Status: DC | PRN
  Administered 2019-04-23: 1000 mL

## 2019-04-23 SURGICAL SUPPLY — 28 items
ARMBAND PINK RESTRICT EXTREMIT (MISCELLANEOUS) ×3 IMPLANT
CANISTER SUCT 3000ML PPV (MISCELLANEOUS) ×3 IMPLANT
CLIP VESOCCLUDE MED 6/CT (CLIP) ×3 IMPLANT
CLIP VESOCCLUDE SM WIDE 6/CT (CLIP) ×3 IMPLANT
COVER PROBE W GEL 5X96 (DRAPES) IMPLANT
COVER WAND RF STERILE (DRAPES) ×3 IMPLANT
DERMABOND ADVANCED (GAUZE/BANDAGES/DRESSINGS) ×2
DERMABOND ADVANCED .7 DNX12 (GAUZE/BANDAGES/DRESSINGS) ×1 IMPLANT
ELECT REM PT RETURN 9FT ADLT (ELECTROSURGICAL) ×3
ELECTRODE REM PT RTRN 9FT ADLT (ELECTROSURGICAL) ×1 IMPLANT
GLOVE BIO SURGEON STRL SZ7.5 (GLOVE) ×3 IMPLANT
GOWN STRL REUS W/ TWL LRG LVL3 (GOWN DISPOSABLE) ×2 IMPLANT
GOWN STRL REUS W/ TWL XL LVL3 (GOWN DISPOSABLE) ×1 IMPLANT
GOWN STRL REUS W/TWL LRG LVL3 (GOWN DISPOSABLE) ×4
GOWN STRL REUS W/TWL XL LVL3 (GOWN DISPOSABLE) ×2
INSERT FOGARTY SM (MISCELLANEOUS) IMPLANT
KIT BASIN OR (CUSTOM PROCEDURE TRAY) ×3 IMPLANT
KIT TURNOVER KIT B (KITS) ×3 IMPLANT
NS IRRIG 1000ML POUR BTL (IV SOLUTION) ×3 IMPLANT
PACK CV ACCESS (CUSTOM PROCEDURE TRAY) ×3 IMPLANT
PAD ARMBOARD 7.5X6 YLW CONV (MISCELLANEOUS) ×6 IMPLANT
SUT MNCRL AB 4-0 PS2 18 (SUTURE) ×3 IMPLANT
SUT PROLENE 6 0 BV (SUTURE) ×3 IMPLANT
SUT VIC AB 3-0 SH 27 (SUTURE) ×2
SUT VIC AB 3-0 SH 27X BRD (SUTURE) ×1 IMPLANT
TOWEL GREEN STERILE (TOWEL DISPOSABLE) ×3 IMPLANT
UNDERPAD 30X30 (UNDERPADS AND DIAPERS) ×3 IMPLANT
WATER STERILE IRR 1000ML POUR (IV SOLUTION) ×3 IMPLANT

## 2019-04-23 NOTE — H&P (Signed)
   History and Physical Update  The patient was interviewed and re-examined.  The patient's previous History and Physical has been reviewed and is unchanged from previous workup. She had a cold right upper extremity s/p embolectomy and is too initiate coumadin upon discharge.   Kerri Brown C. Donzetta Matters, MD [facility] [place] Office: [phone] Pager: [phone]   04/23/2019, 7:26 AM

## 2019-04-23 NOTE — Anesthesia Procedure Notes (Signed)
Procedure Name: MAC Date/Time: 04/23/2019 7:52 AM Performed by: Trinna Post., CRNA Pre-anesthesia Checklist: Patient identified, Emergency Drugs available, Suction available, Patient being monitored and Timeout performed Patient Re-evaluated:Patient Re-evaluated prior to induction Oxygen Delivery Method: Simple face mask Preoxygenation: Pre-oxygenation with 100% oxygen Induction Type: IV induction Placement Confirmation: positive ETCO2

## 2019-04-23 NOTE — Anesthesia Postprocedure Evaluation (Signed)
Anesthesia Post Note  Patient: Kerri Brown  Procedure(s) Performed: LEFT ARM ARTERIOVENOUS (AV) FISTULA CREATION (Left )     Patient location during evaluation: PACU Anesthesia Type: MAC Level of consciousness: awake and alert Pain management: pain level controlled Vital Signs Assessment: post-procedure vital signs reviewed and stable Respiratory status: spontaneous breathing and respiratory function stable Cardiovascular status: stable Postop Assessment: no apparent nausea or vomiting Anesthetic complications: no    Last Vitals:  Vitals:   04/23/19 0900 04/23/19 0906  BP:    Pulse: (!) 51 (!) 49  Resp: 17 16  Temp:  36.5 C  SpO2: 100% 100%    Last Pain:  Vitals:   04/23/19 0906  TempSrc:   PainSc: 0-No pain                 Nickole Adamek DANIEL

## 2019-04-23 NOTE — Transfer of Care (Signed)
Immediate Anesthesia Transfer of Care Note  Patient: Kerri Brown  Procedure(s) Performed: LEFT ARM ARTERIOVENOUS (AV) FISTULA CREATION (Left )  Patient Location: PACU  Anesthesia Type:MAC  Level of Consciousness: awake, alert  and oriented  Airway & Oxygen Therapy: Patient Spontanous Breathing and Patient connected to face mask oxygen  Post-op Assessment: Report given to RN and Post -op Vital signs reviewed and stable  Post vital signs: Reviewed and stable  Last Vitals:  Vitals Value Taken Time  BP 121/69 04/23/19 0840  Temp    Pulse 48 04/23/19 0842  Resp 18 04/23/19 0842  SpO2 100 % 04/23/19 0842  Vitals shown include unvalidated device data.  Last Pain:  Vitals:   04/23/19 0713  TempSrc:   PainSc: 0-No pain      Patients Stated Pain Goal: 5 (27/06/23 7628)  Complications: No apparent anesthesia complications

## 2019-04-23 NOTE — Discharge Instructions (Signed)
[facility]  Discharge Instructions  AV Fistula or Graft Surgery for Dialysis Access  Please refer to the following instructions for your post-procedure care. Your surgeon or physician assistant will discuss any changes with you.  Activity  You may drive the day following your surgery, if you are comfortable and no longer taking prescription pain medication. Resume full activity as the soreness in your incision resolves.  Bathing/Showering  You may shower after you go home. Keep your incision dry for 48 hours. Do not soak in a bathtub, hot tub, or swim until the incision heals completely. You may not shower if you have a hemodialysis catheter.  Incision Care  Clean your incision with mild soap and water after 48 hours. Pat the area dry with a clean towel. You do not need a bandage unless otherwise instructed. Do not apply any ointments or creams to your incision. You may have skin glue on your incision. Do not peel it off. It will come off on its own in about one week. Your arm may swell a bit after surgery. To reduce swelling use pillows to elevate your arm so it is above your heart. Your doctor will tell you if you need to lightly wrap your arm with an ACE bandage.  Diet  Resume your normal diet. There are not special food restrictions following this procedure. In order to heal from your surgery, it is CRITICAL to get adequate nutrition. Your body requires vitamins, minerals, and protein. Vegetables are the best source of vitamins and minerals. Vegetables also provide the perfect balance of protein. Processed food has little nutritional value, so try to avoid this.  Medications  Resume taking all of your medications. If your incision is causing pain, you may take over-the counter pain relievers such as acetaminophen (Tylenol). If you were prescribed a stronger pain medication, please be aware these medications can cause nausea and constipation. Prevent  nausea by taking the medication with a snack or meal. Avoid constipation by drinking plenty of fluids and eating foods with high amount of fiber, such as fruits, vegetables, and grains.  Do not take Tylenol if you are taking prescription pain medications.  RESTART COUMADIN TOMORROW, 04/24/2019  Follow up Your surgeon may want to see you in the office following your access surgery. If so, this will be arranged at the time of your surgery.  Please call us immediately for any of the following conditions:  . Increased pain, redness, drainage (pus) from your incision site . Fever of 101 degrees or higher . Severe or worsening pain at your incision site . Hand pain or numbness. .  Reduce your risk of vascular disease:  . Stop smoking. If you would like help, call [facility] at 1-800-QUIT-NOW (424)494-7671) or [place] at 4101255138  . Manage your cholesterol . Maintain a desired weight . Control your diabetes . Keep your blood pressure down  Dialysis  It will take several weeks to several months for your new dialysis access to be ready for use. Your surgeon will determine when it is okay to use it. Your nephrologist will continue to direct your dialysis. You can continue to use your Permcath until your new access is ready for use.   04/23/2019 Kerri Brown [id number] [birth year]  Surgeon(s): Waynetta Sandy, MD  Procedure(s): LEFT ARM ARTERIOVENOUS (AV) FISTULA CREATION  x Do not stick fistula for 12 weeks    If you have any questions, please call the office at [phone].

## 2019-04-23 NOTE — Op Note (Signed)
    Patient name: Kerri Brown MRN: [id number] DOB: [birth year] Sex: female  04/23/2019 Pre-operative Diagnosis: [id number] Post-operative diagnosis:  Same Surgeon:  Erlene Quan C. Donzetta Matters, MD. Assistant: Leontine Locket, PA Procedure Performed: Left arm first stage basilic vein fistula creation  Indications: 74 year old female with chronic kidney disease.  She is now indicated for hemodialysis access and appears to have suitable basilic vein in the left upper extremity.  Findings: Left upper extremity basilic vein measure approximately 5 mm at the antecubitum.  The artery was healthy was 4 mm external diameter.  At completion there was a strong thrill in the basilic vein there is a palpable radial pulse at the wrist.   Procedure:  The patient was identified in the holding area and taken to the operating where MAC anesthesia was induced.  She was gently prepped and draped in the left upper extremity usual fashion antibiotics were administered and timeout was called.  Ultrasound was used to identify the basilic vein this was noted to be large and compressible.  There is anesthetized 1% lidocaine.  Transverse incision was made below the antecubitum.  I dissected out the basilic vein dividing branches between clips and ties.  The vein was marked for orientation.  I dissected to the deep fascia identified the brachial artery and placed a vessel loop around this.  The vein was then transected distally and tied off.  I spatulated it dilated it flushed with heparinized saline and clamped it.  I then clamped the artery distally proximally opened the longitudinally flushed with heparinized saline distally only as proximally I could not get the tip to flush.  I then sewed the vein into side with 6-0 Prolene suture.  Prior completion of the flushing directions.  Upon completion I released all the clamps.  I had a good thrill in the vein confirmed with Doppler.  Also had a palpable radial pulse the wrist also confirmed with  Doppler.  Satisfied with this we obtain hemostasis.  We closed in layers with Vicryl and Monocryl.  Dermabond placed to level skin.  She was awakened from anesthesia having tolerated procedure well immediate complication all counts were correct at completion.  EBL: 10 cc    Amara Manalang C. Donzetta Matters, MD [facility] [place] Office: [phone] Pager: [phone]

## 2019-04-27 ENCOUNTER — Other Ambulatory Visit: Payer: Self-pay

## 2019-04-27 ENCOUNTER — Ambulatory Visit (INDEPENDENT_AMBULATORY_CARE_PROVIDER_SITE_OTHER): Payer: Self-pay | Admitting: Physician Assistant

## 2019-04-27 VITALS — BP 186/98 | HR 59 | Temp 97.3°F | Resp 16 | Ht 67.0 in | Wt 162.0 lb

## 2019-04-27 DIAGNOSIS — N184 Chronic kidney disease, stage 4 (severe): Secondary | ICD-10-CM

## 2019-04-27 NOTE — Progress Notes (Signed)
  POST OPERATIVE OFFICE NOTE    CC:  F/u for surgery, POD 4  HPI:  This is a 74 y.o. female who is s/p left first stage basilic vein fistula by Dr. Donzetta Matters 4 days ago on April 23, 2019.  She called the office this morning complaining of red, swollen arm.  She denies fever, chills or drainage from her incision. Upon examination, the patient states her swelling has actually improved since yesterday.  She denies hand pain.  She has not yet receiving dialysis  No Known Allergies  Current Outpatient Medications  Medication Sig Dispense Refill  . acetaminophen (TYLENOL) 500 MG tablet Take 500-1,000 mg by mouth every 6 (six) hours as needed for moderate pain or headache.    . allopurinol (ZYLOPRIM) 100 MG tablet Take 100 mg by mouth at bedtime.     . cholecalciferol (VITAMIN D) 1000 units tablet Take 1,000 Units by mouth daily.    . Ferrous Gluconate (IRON) 240 (27 Fe) MG TABS Take 240 mg by mouth daily.    . furosemide (LASIX) 40 MG tablet Take 40 mg by mouth 2 (two) times daily.     Marland Kitchen LANTUS SOLOSTAR 100 UNIT/ML Solostar Pen Inject 45 Units into the skin daily.     . metoprolol tartrate (LOPRESSOR) 25 MG tablet Take 25 mg by mouth 2 (two) times daily.     Marland Kitchen omeprazole (PRILOSEC) 20 MG capsule Take 1 capsule by mouth once daily (Patient taking differently: Take 20 mg by mouth at bedtime. ) 90 capsule 1  . oxyCODONE (ROXICODONE) 5 MG immediate release tablet Take 1 tablet (5 mg total) by mouth every 6 (six) hours as needed. 8 tablet 0  . rosuvastatin (CRESTOR) 10 MG tablet Take 10 mg by mouth every Friday.     . sodium bicarbonate 650 MG tablet Take 650 mg by mouth 3 (three) times daily.    Marland Kitchen warfarin (COUMADIN) 5 MG tablet Take 1 tablet (5 mg total) by mouth daily. 30 tablet 1   No current facility-administered medications for this visit.     ROS:  See HPI  Physical Exam:  Vitals:   04/27/19 1349  Weight: 162 lb (73.5 kg)  Height: 5\' 7"  (1.702 m)   Incision: Well approximated.   Dermabond intact. Extremities: Mild edema of the left volar aspect of her mid upper extremity.  She has significant ecchymosis extending from just above her incision to medial aspect of her forearm.  This area is soft without increased warmth.  She has a 2+ radial pulse.  Good bruit in fistula.  5/5 bilateral strength Neuro: And oriented x4 in no apparent distress   Assessment/Plan:  This is a 74 y.o. female who is s/p: Left first stage basilic vein fistula 4 days ago. She is maintained on Coumadin and this is the likely source of her ecchymosis.  There are no signs or symptoms of ongoing bleeding.  Recommended continued elevation and extension of her elbow.  -Follow-up as arranged on June 05, 2019   Barbie Banner, PA-C [facility] [phone]  Clinic MD: Trula Slade

## 2019-05-13 DIAGNOSIS — E1122 Type 2 diabetes mellitus with diabetic chronic kidney disease: Secondary | ICD-10-CM | POA: Diagnosis not present

## 2019-05-13 DIAGNOSIS — N186 End stage renal disease: Secondary | ICD-10-CM | POA: Diagnosis not present

## 2019-05-13 DIAGNOSIS — I4819 Other persistent atrial fibrillation: Secondary | ICD-10-CM | POA: Diagnosis not present

## 2019-05-13 DIAGNOSIS — E782 Mixed hyperlipidemia: Secondary | ICD-10-CM | POA: Diagnosis not present

## 2019-05-13 DIAGNOSIS — Z6825 Body mass index (BMI) 25.0-25.9, adult: Secondary | ICD-10-CM | POA: Diagnosis not present

## 2019-05-13 DIAGNOSIS — I10 Essential (primary) hypertension: Secondary | ICD-10-CM | POA: Diagnosis not present

## 2019-05-14 DIAGNOSIS — I4819 Other persistent atrial fibrillation: Secondary | ICD-10-CM | POA: Diagnosis not present

## 2019-05-14 DIAGNOSIS — K648 Other hemorrhoids: Secondary | ICD-10-CM | POA: Diagnosis not present

## 2019-05-14 DIAGNOSIS — K5790 Diverticulosis of intestine, part unspecified, without perforation or abscess without bleeding: Secondary | ICD-10-CM | POA: Diagnosis not present

## 2019-05-14 DIAGNOSIS — I10 Essential (primary) hypertension: Secondary | ICD-10-CM | POA: Diagnosis not present

## 2019-05-14 DIAGNOSIS — I82621 Acute embolism and thrombosis of deep veins of right upper extremity: Secondary | ICD-10-CM | POA: Diagnosis not present

## 2019-05-14 DIAGNOSIS — E1122 Type 2 diabetes mellitus with diabetic chronic kidney disease: Secondary | ICD-10-CM | POA: Diagnosis not present

## 2019-05-14 DIAGNOSIS — M62241 Nontraumatic ischemic infarction of muscle, right hand: Secondary | ICD-10-CM | POA: Diagnosis not present

## 2019-05-14 DIAGNOSIS — E782 Mixed hyperlipidemia: Secondary | ICD-10-CM | POA: Diagnosis not present

## 2019-06-04 ENCOUNTER — Other Ambulatory Visit: Payer: Self-pay | Admitting: *Deleted

## 2019-06-04 DIAGNOSIS — N184 Chronic kidney disease, stage 4 (severe): Secondary | ICD-10-CM | POA: Diagnosis not present

## 2019-06-04 DIAGNOSIS — E559 Vitamin D deficiency, unspecified: Secondary | ICD-10-CM | POA: Diagnosis not present

## 2019-06-04 DIAGNOSIS — D631 Anemia in chronic kidney disease: Secondary | ICD-10-CM | POA: Diagnosis not present

## 2019-06-04 DIAGNOSIS — Z79899 Other long term (current) drug therapy: Secondary | ICD-10-CM | POA: Diagnosis not present

## 2019-06-04 DIAGNOSIS — R809 Proteinuria, unspecified: Secondary | ICD-10-CM | POA: Diagnosis not present

## 2019-06-05 ENCOUNTER — Ambulatory Visit (HOSPITAL_COMMUNITY)
Admission: RE | Admit: 2019-06-05 | Discharge: 2019-06-05 | Disposition: A | Discharge status: Home / Self Care | Payer: Medicare HMO | Source: Ambulatory Visit | Attending: Vascular Surgery | Admitting: Vascular Surgery

## 2019-06-05 ENCOUNTER — Other Ambulatory Visit: Payer: Self-pay

## 2019-06-05 ENCOUNTER — Ambulatory Visit (INDEPENDENT_AMBULATORY_CARE_PROVIDER_SITE_OTHER): Payer: Self-pay | Admitting: Vascular Surgery

## 2019-06-05 ENCOUNTER — Ambulatory Visit (HOSPITAL_COMMUNITY): Payer: Medicare HMO

## 2019-06-05 ENCOUNTER — Encounter: Payer: Self-pay | Admitting: Vascular Surgery

## 2019-06-05 ENCOUNTER — Encounter: Payer: Medicare HMO | Admitting: Vascular Surgery

## 2019-06-05 VITALS — BP 194/98 | HR 60 | Temp 97.2°F | Resp 20 | Ht 67.0 in | Wt 162.0 lb

## 2019-06-05 DIAGNOSIS — N184 Chronic kidney disease, stage 4 (severe): Secondary | ICD-10-CM | POA: Insufficient documentation

## 2019-06-05 NOTE — Progress Notes (Signed)
Patient ID: Kerri Brown, female   DOB: [birth year], 74 y.o.   MRN: [id number]  Reason for Consult: Post-op Follow-up   Referred by Curlene Labrum, MD  Subjective:     HPI:  Kerri Brown is a 74 y.o. female history of first stage basilic vein fistula creation.  She has chronic kidney disease stage IV.  She does have some left upper extremity pain extending from the shoulder down.  Occasional numbness on awakening in the morning but does not have any frank hand pain no tissue loss or ulceration.  Past Medical History:  Diagnosis Date  . Anemia   . Arthritis   . CKD (chronic kidney disease)    Stage IV  . Constipation   . Diabetes ([place])    type 2  . GERD (gastroesophageal reflux disease)   . Hyperlipemia   . Hypertension   . PAF (paroxysmal atrial fibrillation) ([place])   . Pneumonia   . Upper limb arterial embolus ([place]) 04/11/2019   RUE, s/p thromboembolectomy ([facility])   Family History  Problem Relation Age of Onset  . Heart disease Father        had a pacemaker  . Diabetes Father   . Diabetes Sister   . Diabetes Sister   . Diabetes Other    Past Surgical History:  Procedure Laterality Date  . AV FISTULA PLACEMENT Left 04/23/2019   Procedure: LEFT ARM ARTERIOVENOUS (AV) FISTULA CREATION;  Surgeon: Waynetta Sandy, MD;  Location: [place];  Service: Vascular;  Laterality: Left;  . BRACHIAL EMBOLECTOMY Right 04/11/2019  . COLONOSCOPY    . KNEE ARTHROSCOPY Right   . LAPAROSCOPIC CHOLECYSTECTOMY    . TOTAL HIP ARTHROPLASTY Right     Short Social History:  Social History   Tobacco Use  . Smoking status: Former Smoker    Packs/day: 0.50    Years: 44.00    Pack years: 22.00    Types: Cigarettes    Start date: 05/04/1970    Quit date: 05/04/2014    Years since quitting: 5.0  . Smokeless tobacco: Never Used  Substance Use Topics  . Alcohol use: Not Currently    Alcohol/week: 0.0 standard drinks    No Known Allergies  Current Outpatient  Medications  Medication Sig Dispense Refill  . acetaminophen (TYLENOL) 500 MG tablet Take 500-1,000 mg by mouth every 6 (six) hours as needed for moderate pain or headache.    . allopurinol (ZYLOPRIM) 100 MG tablet Take 100 mg by mouth at bedtime.     . cholecalciferol (VITAMIN D) 1000 units tablet Take 1,000 Units by mouth daily.    . Ferrous Gluconate (IRON) 240 (27 Fe) MG TABS Take 240 mg by mouth daily.    . furosemide (LASIX) 40 MG tablet Take 40 mg by mouth 2 (two) times daily.     Marland Kitchen LANTUS SOLOSTAR 100 UNIT/ML Solostar Pen Inject 45 Units into the skin daily.     . metoprolol tartrate (LOPRESSOR) 25 MG tablet Take 25 mg by mouth 2 (two) times daily.     Marland Kitchen omeprazole (PRILOSEC) 20 MG capsule Take 1 capsule by mouth once daily (Patient taking differently: Take 20 mg by mouth at bedtime. ) 90 capsule 1  . oxyCODONE (ROXICODONE) 5 MG immediate release tablet Take 1 tablet (5 mg total) by mouth every 6 (six) hours as needed. 8 tablet 0  . rosuvastatin (CRESTOR) 10 MG tablet Take 10 mg by mouth every Friday.     . sodium bicarbonate  650 MG tablet Take 650 mg by mouth 3 (three) times daily.    Marland Kitchen warfarin (COUMADIN) 5 MG tablet Take 1 tablet (5 mg total) by mouth daily. 30 tablet 1   No current facility-administered medications for this visit.    Review of Systems  Constitutional:  Constitutional negative. HENT: HENT negative.  Eyes: Eyes negative.  Respiratory: Respiratory negative.  Cardiovascular: Cardiovascular negative.  GI: Gastrointestinal negative.  Musculoskeletal:       Left arm pain Skin: Skin negative.  Neurological: Positive for dizziness and focal weakness.  Hematologic: Positive for bruises/bleeds easily.  Psychiatric: Psychiatric negative.        Objective:  Objective  Vitals:   06/05/19 1138  BP: (!) 194/98  Pulse: 60  Resp: 20  Temp: (!) 97.2 F (36.2 C)  SpO2: 100%    Physical Exam HENT:     Head: Normocephalic.     Nose:     Comments: Mask in  place Eyes:     Pupils: Pupils are equal, round, and reactive to light.  Cardiovascular:     Rate and Rhythm: Normal rate.     Pulses:          Radial pulses are 2+ on the right side and 2+ on the left side.  Abdominal:     General: Abdomen is flat.  Musculoskeletal:     Comments: Strong thrill left medial upper arm  Skin:    Capillary Refill: Capillary refill takes less than 2 seconds.  Neurological:     Mental Status: She is alert.  Psychiatric:        Mood and Affect: Mood normal.        Behavior: Behavior normal.        Thought Content: Thought content normal.        Judgment: Judgment normal.     Data: I have independently interpreted her dialysis duplex.  Flow volumes 1650 mL/min.  Diameter up as high as 0.8 cm in the distal upper arm.     Assessment/Plan:     74 year old female with chronic kidney disease.  She presents for evaluation of fistula which now has nice flow volumes and is very strong thrill.  The fistula has matured nicely and is appropriate for transposition at this time.  She does have some left upper extremity nonspecific pains may be related to compression at her thoracic outlet given the size of the fistula.  Either way we will proceed with superficial station so the fistula is ready if and when needed.  Hold Coumadin 3 days prior to procedure.    Waynetta Sandy MD [facility]

## 2019-06-08 DIAGNOSIS — Z23 Encounter for immunization: Secondary | ICD-10-CM | POA: Diagnosis not present

## 2019-06-09 ENCOUNTER — Other Ambulatory Visit: Payer: Self-pay

## 2019-06-12 DIAGNOSIS — E1122 Type 2 diabetes mellitus with diabetic chronic kidney disease: Secondary | ICD-10-CM | POA: Diagnosis not present

## 2019-06-12 DIAGNOSIS — E211 Secondary hyperparathyroidism, not elsewhere classified: Secondary | ICD-10-CM | POA: Diagnosis not present

## 2019-06-12 DIAGNOSIS — E872 Acidosis: Secondary | ICD-10-CM | POA: Diagnosis not present

## 2019-06-12 DIAGNOSIS — N185 Chronic kidney disease, stage 5: Secondary | ICD-10-CM | POA: Diagnosis not present

## 2019-06-12 DIAGNOSIS — E1129 Type 2 diabetes mellitus with other diabetic kidney complication: Secondary | ICD-10-CM | POA: Diagnosis not present

## 2019-06-12 DIAGNOSIS — R809 Proteinuria, unspecified: Secondary | ICD-10-CM | POA: Diagnosis not present

## 2019-06-12 DIAGNOSIS — E559 Vitamin D deficiency, unspecified: Secondary | ICD-10-CM | POA: Diagnosis not present

## 2019-06-12 DIAGNOSIS — I129 Hypertensive chronic kidney disease with stage 1 through stage 4 chronic kidney disease, or unspecified chronic kidney disease: Secondary | ICD-10-CM | POA: Diagnosis not present

## 2019-06-23 ENCOUNTER — Other Ambulatory Visit (HOSPITAL_COMMUNITY)
Admission: RE | Admit: 2019-06-23 | Discharge: 2019-06-23 | Disposition: A | Discharge status: Home / Self Care | Payer: Medicare HMO | Source: Ambulatory Visit | Attending: Vascular Surgery | Admitting: Vascular Surgery

## 2019-06-23 DIAGNOSIS — Z20822 Contact with and (suspected) exposure to covid-19: Secondary | ICD-10-CM | POA: Diagnosis not present

## 2019-06-23 DIAGNOSIS — Z01812 Encounter for preprocedural laboratory examination: Secondary | ICD-10-CM | POA: Insufficient documentation

## 2019-06-23 LAB — SARS CORONAVIRUS 2 (TAT 6-24 HRS): SARS Coronavirus 2: NEGATIVE

## 2019-06-24 ENCOUNTER — Other Ambulatory Visit: Payer: Self-pay

## 2019-06-24 ENCOUNTER — Encounter (HOSPITAL_COMMUNITY): Payer: Self-pay | Admitting: Vascular Surgery

## 2019-06-24 NOTE — Anesthesia Preprocedure Evaluation (Addendum)
Anesthesia Evaluation  Patient identified by MRN, date of birth, ID band Patient awake    Reviewed: Allergy & Precautions, NPO status , Patient's Chart, lab work & pertinent test results  Airway Mallampati: II  TM Distance: >3 FB Neck ROM: Full    Dental  (+) Teeth Intact, Dental Advisory Given   Pulmonary former smoker,    breath sounds clear to auscultation       Cardiovascular hypertension, Pt. on medications and Pt. on home beta blockers + Peripheral Vascular Disease  + dysrhythmias Atrial Fibrillation  Rhythm:Regular Rate:Normal     Neuro/Psych negative neurological ROS  negative psych ROS   GI/Hepatic Neg liver ROS, GERD  Medicated,  Endo/Other  diabetes  Renal/GU ESRFRenal disease     Musculoskeletal  (+) Arthritis ,   Abdominal Normal abdominal exam  (+)   Peds  Hematology   Anesthesia Other Findings   Reproductive/Obstetrics                           EKG: sinus bradycardia.  Anesthesia Physical Anesthesia Plan  ASA: III  Anesthesia Plan: MAC   Post-op Pain Management:    Induction: Intravenous  PONV Risk Score and Plan: 3 and Propofol infusion, Ondansetron and Treatment may vary due to age or medical condition  Airway Management Planned: Natural Airway and Simple Face Mask  Additional Equipment: None  Intra-op Plan:   Post-operative Plan:   Informed Consent: I have reviewed the patients History and Physical, chart, labs and discussed the procedure including the risks, benefits and alternatives for the proposed anesthesia with the patient or authorized representative who has indicated his/her understanding and acceptance.     Dental advisory given  Plan Discussed with: CRNA  Anesthesia Plan Comments: (PAT note written 06/24/2019 by Myra Gianotti, PA-C. )      Anesthesia Quick Evaluation

## 2019-06-24 NOTE — Progress Notes (Signed)
Patient denies shortness of breath, fever, cough and chest pain.  PCP - Dr Judd Lien Cardiologist - Dr Kate Sable Nephrology -  Dr. Ulice Bold  Chest x-ray -  n/a EKG - 10/25/18 Stress Test - n/a ECHO - 05/26/15 Cardiac Cath - n/a  Fasting Blood Sugar - 110-130s Checks Blood Sugar 1 times a day  . THE MORNING OF SURGERY, take 22 units of Lantus insulin.  . If your blood sugar is less than 70 mg/dL, you will need to treat for low blood sugar: o Treat a low blood sugar (less than 70 mg/dL) with  cup of clear juice (cranberry or apple), 4 glucose tablets, OR glucose gel. o Recheck blood sugar in 15 minutes after treatment (to make sure it is greater than 70 mg/dL). If your blood sugar is not greater than 70 mg/dL on recheck, call [phone] for further instructions.  Anesthesia review: Yes  STOP now taking any Aspirin (unless otherwise instructed by your surgeon), Aleve, Naproxen, Ibuprofen, Motrin, Advil, Goody's, BC's, all herbal medications, fish oil, and all vitamins.   Coronavirus Screening Covid test on 06/23/19 was negative.  Patient verbalized understanding of instructions that were given via phone.

## 2019-06-24 NOTE — Progress Notes (Signed)
Anesthesia Chart Review: Kerri Brown   Case: [id number] Date/Time: 06/25/19 1000   Procedure: BASCILIC VEIN TRANSPOSITION (Left )   Anesthesia type: Monitor Anesthesia Care   Pre-op diagnosis: CHRONIC KIDNEY DISEASE STAGE 4   Location: [place] OR ROOM 16 / [place] OR   Surgeons: Waynetta Sandy, MD      DISCUSSION: DISCUSSION: Patient is a 74 year old female scheduled for the above procedure. She is s/p first stage basilic vein transposition on 04/23/19.   History includes former smoker (quit 05/04/14), PAF, DM2 (on insulin), CKD (stage V), anemia, HTN, HLD, ischemic right hand (in setting of PAF with Xarelto noncompliance due to cost, s/p thromboembolectomy of right arm/brachial artery with angiogram 04/11/19 by Janet Berlin, MD with [facility]; she was transitioned to warfarin by cardiology following 04/23/19 AVF).   Last evaluated by nephrologist Dr. Theador Hawthorne on 06/12/19. No uremic symptoms.  Aware of another vascular procedure for future HD access.  Per PAT phone interview per RN, patient denied shortness of breath, cough, fever, chest pain.  Reported fasting CBGs of 110-130's.  Per [facility], hold warfarin 3 days prior to surgery.   Pre-operative COVID-19 test on 06/23/19 was negative. She is for labs and anestheisa team evaluation on the day of surgery.    VS: Ht 5\' 7"  (1.702 m)   Wt 73.5 kg   LMP  (LMP Unknown)   BMI 25.37 kg/m   BP Readings from Last 3 Encounters:  06/05/19 (!) 194/98  04/27/19 (!) 186/98  04/23/19 135/74   Pulse Readings from Last 3 Encounters:  06/05/19 60  04/27/19 (!) 59  04/23/19 (!) 49     PROVIDERS: - [place], Virgina Evener, MD is PCP ([facility]) - Kate Sable, MD is cardiologist. Last visit 11/04/18. PAF symptomatically stable on metoprolol. Considering switching from Xarelto to warfarin due to cost. - Ulice Bold, MD is nephrologist ([place])   LABS: She needs ISTAT day of surgery with PT/INR. As of  06/04/19 ([facility]) K 4.4, glucose 79, Cr 3.15, H/H 11.7/36.7, PLT 205. A1c 7.4% on 04/12/19 ([facility]).   EKG: - EKG 11/04/18 ([facility]): Sinus bradycardia with marked sinus arrhythmia Right bundle branch block Left anterior fascicular block Bifascicular block - Bifascicular block also noted on prior tracing from 05/28/17   CV: Echo 05/26/15: Study Conclusions  - Left ventricle: The cavity size was normal. Wall thickness was  increased in a pattern of mild LVH. Systolic function was normal.  The estimated ejection fraction was in the range of 60% to 65%.  Wall motion was normal; there were no regional wall motion  abnormalities. Features are consistent with a pseudonormal left  ventricular filling pattern, with concomitant abnormal relaxation  and increased filling pressure (grade 2 diastolic dysfunction).  - Mitral valve: There was trivial regurgitation.  - Left atrium: The atrium was mildly dilated.  - Right atrium: Central venous pressure (est): 3 mm Hg.  - Atrial septum: The septum was thickened.  - Tricuspid valve: There was trivial regurgitation.  - Pulmonary arteries: PA peak pressure: 15 mm Hg (S).  - Pericardium, extracardiac: There was no pericardial effusion.  Impressions:  - Mild LVH with LVEF 60-65%. Grade 2 diastolic dysfunction. Mild  left atrial enlargement. Trivial mitral and tricuspid  regurgitation. Thickened interatrial septum. PASP estimated 15  mmHg.    Cardiac event monitor 04/15/15-05/05/15:  Study Highlights Sinus rhythm and paroxysmal rapid atrial fibrillation.  Asymptomatic.   Past Medical History:  Diagnosis Date  . Anemia   .  Arthritis   . Bronchitis    hx  . CKD (chronic kidney disease)    Stage 5 due to DM  . Constipation    occasional  . Diabetes ([place])    type 2  . GERD (gastroesophageal reflux disease)   . Hyperlipemia   . Hypertension   . PAF (paroxysmal atrial fibrillation) ([place])   . Upper limb  arterial embolus ([place]) 04/11/2019   RUE, s/p thromboembolectomy ([facility])    Past Surgical History:  Procedure Laterality Date  . AV FISTULA PLACEMENT Left 04/23/2019   Procedure: LEFT ARM ARTERIOVENOUS (AV) FISTULA CREATION;  Surgeon: Waynetta Sandy, MD;  Location: [place];  Service: Vascular;  Laterality: Left;  . BRACHIAL EMBOLECTOMY Right 04/11/2019  . COLONOSCOPY    . KNEE ARTHROSCOPY Right   . LAPAROSCOPIC CHOLECYSTECTOMY    . TOTAL HIP ARTHROPLASTY Right     MEDICATIONS: No current facility-administered medications for this encounter.   Marland Kitchen acetaminophen (TYLENOL) 500 MG tablet  . allopurinol (ZYLOPRIM) 100 MG tablet  . benazepril (LOTENSIN) 40 MG tablet  . cholecalciferol (VITAMIN D) 1000 units tablet  . Ferrous Gluconate (IRON) 240 (27 Fe) MG TABS  . furosemide (LASIX) 40 MG tablet  . LANTUS SOLOSTAR 100 UNIT/ML Solostar Pen  . metoprolol tartrate (LOPRESSOR) 25 MG tablet  . omeprazole (PRILOSEC) 20 MG capsule  . rosuvastatin (CRESTOR) 10 MG tablet  . sodium bicarbonate 650 MG tablet  . warfarin (COUMADIN) 5 MG tablet  . oxyCODONE (ROXICODONE) 5 MG immediate release tablet  Not currently taking oxycodone.   Myra Gianotti, PA-C Surgical Short Stay/Anesthesiology [facility] Phone [phone] [facility] Phone [phone] 06/24/2019 3:13 PM

## 2019-06-25 ENCOUNTER — Other Ambulatory Visit: Payer: Self-pay

## 2019-06-25 ENCOUNTER — Ambulatory Visit (HOSPITAL_COMMUNITY): Payer: Medicare HMO | Admitting: Vascular Surgery

## 2019-06-25 ENCOUNTER — Encounter (HOSPITAL_COMMUNITY): Payer: Self-pay | Admitting: Vascular Surgery

## 2019-06-25 ENCOUNTER — Ambulatory Visit (HOSPITAL_COMMUNITY)
Admission: RE | Admit: 2019-06-25 | Discharge: 2019-06-25 | Disposition: A | Discharge status: Home / Self Care | Payer: Medicare HMO | Attending: Vascular Surgery | Admitting: Vascular Surgery

## 2019-06-25 ENCOUNTER — Encounter (HOSPITAL_COMMUNITY)
Admission: RE | Disposition: A | Discharge status: Home / Self Care | Payer: Self-pay | Source: Home / Self Care | Attending: Vascular Surgery

## 2019-06-25 DIAGNOSIS — E1122 Type 2 diabetes mellitus with diabetic chronic kidney disease: Secondary | ICD-10-CM | POA: Insufficient documentation

## 2019-06-25 DIAGNOSIS — Z794 Long term (current) use of insulin: Secondary | ICD-10-CM | POA: Diagnosis not present

## 2019-06-25 DIAGNOSIS — K219 Gastro-esophageal reflux disease without esophagitis: Secondary | ICD-10-CM | POA: Insufficient documentation

## 2019-06-25 DIAGNOSIS — I129 Hypertensive chronic kidney disease with stage 1 through stage 4 chronic kidney disease, or unspecified chronic kidney disease: Secondary | ICD-10-CM | POA: Diagnosis not present

## 2019-06-25 DIAGNOSIS — Z7901 Long term (current) use of anticoagulants: Secondary | ICD-10-CM | POA: Diagnosis not present

## 2019-06-25 DIAGNOSIS — Z79899 Other long term (current) drug therapy: Secondary | ICD-10-CM | POA: Diagnosis not present

## 2019-06-25 DIAGNOSIS — I4891 Unspecified atrial fibrillation: Secondary | ICD-10-CM | POA: Diagnosis not present

## 2019-06-25 DIAGNOSIS — N185 Chronic kidney disease, stage 5: Secondary | ICD-10-CM | POA: Diagnosis not present

## 2019-06-25 DIAGNOSIS — Z87891 Personal history of nicotine dependence: Secondary | ICD-10-CM | POA: Insufficient documentation

## 2019-06-25 DIAGNOSIS — D649 Anemia, unspecified: Secondary | ICD-10-CM | POA: Insufficient documentation

## 2019-06-25 DIAGNOSIS — N184 Chronic kidney disease, stage 4 (severe): Secondary | ICD-10-CM | POA: Diagnosis not present

## 2019-06-25 DIAGNOSIS — E785 Hyperlipidemia, unspecified: Secondary | ICD-10-CM | POA: Diagnosis not present

## 2019-06-25 DIAGNOSIS — I480 Paroxysmal atrial fibrillation: Secondary | ICD-10-CM | POA: Diagnosis not present

## 2019-06-25 DIAGNOSIS — Z5181 Encounter for therapeutic drug level monitoring: Secondary | ICD-10-CM | POA: Diagnosis not present

## 2019-06-25 HISTORY — DX: Bronchitis, not specified as acute or chronic: J40

## 2019-06-25 HISTORY — PX: BASCILIC VEIN TRANSPOSITION: SHX5742

## 2019-06-25 LAB — GLUCOSE, CAPILLARY
Glucose-Capillary: 88 mg/dL (ref 70–99)
Glucose-Capillary: 76 mg/dL (ref 70–99)
Glucose-Capillary: 89 mg/dL (ref 70–99)

## 2019-06-25 LAB — POCT I-STAT, CHEM 8
BUN: 55 mg/dL — ABNORMAL HIGH (ref 8–23)
Calcium, Ion: 1.15 mmol/L (ref 1.15–1.40)
Chloride: 109 mmol/L (ref 98–111)
Creatinine, Ser: 3.6 mg/dL — ABNORMAL HIGH (ref 0.44–1.00)
Glucose, Bld: 83 mg/dL (ref 70–99)
HCT: 33 % — ABNORMAL LOW (ref 36.0–46.0)
Hemoglobin: 11.2 g/dL — ABNORMAL LOW (ref 12.0–15.0)
Potassium: 4.5 mmol/L (ref 3.5–5.1)
Sodium: 142 mmol/L (ref 135–145)
TCO2: 24 mmol/L (ref 22–32)

## 2019-06-25 LAB — PROTIME-INR
INR: 2.2 — ABNORMAL HIGH (ref 0.8–1.2)
Prothrombin Time: 24.3 seconds — ABNORMAL HIGH (ref 11.4–15.2)

## 2019-06-25 SURGERY — TRANSPOSITION, VEIN, BASILIC
Anesthesia: Monitor Anesthesia Care | Site: Arm Upper | Laterality: Left

## 2019-06-25 MED ORDER — SODIUM CHLORIDE 0.9 % IV SOLN: INTRAVENOUS | Status: DC

## 2019-06-25 MED ORDER — OXYCODONE-ACETAMINOPHEN 10-325 MG PO TABS: 1.0000 | ORAL_TABLET | Freq: Four times a day (QID) | ORAL | 0 refills | Status: DC | PRN

## 2019-06-25 MED ORDER — PROPOFOL 500 MG/50ML IV EMUL
INTRAVENOUS | Status: DC | PRN
  Administered 2019-06-25: 30 ug/kg/min via INTRAVENOUS

## 2019-06-25 MED ORDER — LIDOCAINE 2% (20 MG/ML) 5 ML SYRINGE
INTRAMUSCULAR | Status: DC | PRN
  Administered 2019-06-25: 60 mg via INTRAVENOUS

## 2019-06-25 MED ORDER — LIDOCAINE-EPINEPHRINE (PF) 1 %-1:200000 IJ SOLN
INTRAMUSCULAR | Status: DC | PRN
  Administered 2019-06-25: 40 mL
  Administered 2019-06-25: 20 mL

## 2019-06-25 MED ORDER — OXYCODONE HCL 5 MG PO TABS
5.0000 mg | ORAL_TABLET | Freq: Once | ORAL | Status: AC
  Administered 2019-06-25: 5 mg via ORAL

## 2019-06-25 MED ORDER — FENTANYL CITRATE (PF) 250 MCG/5ML IJ SOLN
INTRAMUSCULAR | Status: AC
  Filled 2019-06-25: qty 5

## 2019-06-25 MED ORDER — PHENYLEPHRINE 40 MCG/ML (10ML) SYRINGE FOR IV PUSH (FOR BLOOD PRESSURE SUPPORT)
PREFILLED_SYRINGE | INTRAVENOUS | Status: AC
  Filled 2019-06-25: qty 10

## 2019-06-25 MED ORDER — OXYCODONE-ACETAMINOPHEN 5-325 MG PO TABS
ORAL_TABLET | ORAL | Status: AC
  Administered 2019-06-25: 13:00:00 1
  Filled 2019-06-25: qty 1

## 2019-06-25 MED ORDER — PHENYLEPHRINE HCL-NACL 10-0.9 MG/250ML-% IV SOLN: INTRAVENOUS | Status: DC | PRN

## 2019-06-25 MED ORDER — SODIUM CHLORIDE 0.9 % IV SOLN
INTRAVENOUS | Status: AC
  Filled 2019-06-25: qty 1.2

## 2019-06-25 MED ORDER — OXYCODONE HCL 5 MG PO TABS
ORAL_TABLET | ORAL | Status: AC
  Administered 2019-06-25: 13:00:00 5 mg
  Filled 2019-06-25: qty 1

## 2019-06-25 MED ORDER — FENTANYL CITRATE (PF) 100 MCG/2ML IJ SOLN: 25.0000 ug | INTRAMUSCULAR | Status: DC | PRN

## 2019-06-25 MED ORDER — OXYCODONE-ACETAMINOPHEN 5-325 MG PO TABS
1.0000 | ORAL_TABLET | Freq: Once | ORAL | Status: AC
  Administered 2019-06-25: 13:00:00 1 via ORAL

## 2019-06-25 MED ORDER — ONDANSETRON HCL 4 MG/2ML IJ SOLN
INTRAMUSCULAR | Status: DC | PRN
  Administered 2019-06-25: 4 mg via INTRAVENOUS

## 2019-06-25 MED ORDER — LIDOCAINE-EPINEPHRINE 1 %-1:100000 IJ SOLN
INTRAMUSCULAR | Status: AC
  Filled 2019-06-25: qty 1

## 2019-06-25 MED ORDER — PROPOFOL 10 MG/ML IV BOLUS
INTRAVENOUS | Status: AC
  Filled 2019-06-25: qty 20

## 2019-06-25 MED ORDER — ACETAMINOPHEN 10 MG/ML IV SOLN: 1000.0000 mg | Freq: Once | INTRAVENOUS | Status: DC | PRN

## 2019-06-25 MED ORDER — CHLORHEXIDINE GLUCONATE 4 % EX LIQD: 60.0000 mL | Freq: Once | CUTANEOUS | Status: DC

## 2019-06-25 MED ORDER — CEFAZOLIN SODIUM-DEXTROSE 2-4 GM/100ML-% IV SOLN
2.0000 g | INTRAVENOUS | Status: AC
  Administered 2019-06-25: 2 g via INTRAVENOUS
  Filled 2019-06-25: qty 100

## 2019-06-25 MED ORDER — ACETAMINOPHEN 160 MG/5ML PO SOLN: 325.0000 mg | Freq: Once | ORAL | Status: DC | PRN

## 2019-06-25 MED ORDER — PHENYLEPHRINE 40 MCG/ML (10ML) SYRINGE FOR IV PUSH (FOR BLOOD PRESSURE SUPPORT)
PREFILLED_SYRINGE | INTRAVENOUS | Status: DC | PRN
  Administered 2019-06-25: 80 ug via INTRAVENOUS

## 2019-06-25 MED ORDER — PROPOFOL 10 MG/ML IV BOLUS
INTRAVENOUS | Status: DC | PRN
  Administered 2019-06-25: 30 mg via INTRAVENOUS

## 2019-06-25 MED ORDER — LIDOCAINE 2% (20 MG/ML) 5 ML SYRINGE
INTRAMUSCULAR | Status: AC
  Filled 2019-06-25: qty 5

## 2019-06-25 MED ORDER — ACETAMINOPHEN 325 MG PO TABS: 325.0000 mg | ORAL_TABLET | Freq: Once | ORAL | Status: DC | PRN

## 2019-06-25 MED ORDER — FENTANYL CITRATE (PF) 100 MCG/2ML IJ SOLN
INTRAMUSCULAR | Status: DC | PRN
  Administered 2019-06-25: 25 ug via INTRAVENOUS

## 2019-06-25 MED ORDER — SODIUM CHLORIDE 0.9 % IV SOLN: INTRAVENOUS | Status: DC | PRN

## 2019-06-25 MED ORDER — LACTATED RINGERS IV SOLN: INTRAVENOUS | Status: DC

## 2019-06-25 MED ORDER — PROMETHAZINE HCL 25 MG/ML IJ SOLN: 6.2500 mg | INTRAMUSCULAR | Status: DC | PRN

## 2019-06-25 MED ORDER — MEPERIDINE HCL 25 MG/ML IJ SOLN: 6.2500 mg | INTRAMUSCULAR | Status: DC | PRN

## 2019-06-25 MED ORDER — ONDANSETRON HCL 4 MG/2ML IJ SOLN
INTRAMUSCULAR | Status: AC
  Filled 2019-06-25: qty 2

## 2019-06-25 MED ORDER — PROTAMINE SULFATE 10 MG/ML IV SOLN
INTRAVENOUS | Status: AC
  Filled 2019-06-25: qty 25

## 2019-06-25 SURGICAL SUPPLY — 37 items
ARMBAND PINK RESTRICT EXTREMIT (MISCELLANEOUS) ×3 IMPLANT
CANISTER SUCT 3000ML PPV (MISCELLANEOUS) ×3 IMPLANT
CLIP VESOCCLUDE MED 24/CT (CLIP) IMPLANT
CLIP VESOCCLUDE MED 6/CT (CLIP) ×3 IMPLANT
CLIP VESOCCLUDE SM WIDE 24/CT (CLIP) IMPLANT
CLIP VESOCCLUDE SM WIDE 6/CT (CLIP) ×3 IMPLANT
COVER PROBE W GEL 5X96 (DRAPES) ×3 IMPLANT
COVER WAND RF STERILE (DRAPES) IMPLANT
DERMABOND ADVANCED (GAUZE/BANDAGES/DRESSINGS) ×2
DERMABOND ADVANCED .7 DNX12 (GAUZE/BANDAGES/DRESSINGS) ×1 IMPLANT
ELECT CAUTERY BLADE 6.4 (BLADE) ×3 IMPLANT
ELECT REM PT RETURN 9FT ADLT (ELECTROSURGICAL) ×3
ELECTRODE REM PT RTRN 9FT ADLT (ELECTROSURGICAL) ×1 IMPLANT
GLOVE BIO SURGEON STRL SZ7.5 (GLOVE) ×3 IMPLANT
GLOVE BIOGEL PI IND STRL 8 (GLOVE) ×1 IMPLANT
GLOVE BIOGEL PI INDICATOR 8 (GLOVE) ×2
GLOVE ECLIPSE 8.0 STRL XLNG CF (GLOVE) ×3 IMPLANT
GLOVE SS BIOGEL STRL SZ 6.5 (GLOVE) ×1 IMPLANT
GLOVE SUPERSENSE BIOGEL SZ 6.5 (GLOVE) ×2
GOWN STRL REUS W/ TWL LRG LVL3 (GOWN DISPOSABLE) ×3 IMPLANT
GOWN STRL REUS W/ TWL XL LVL3 (GOWN DISPOSABLE) ×1 IMPLANT
GOWN STRL REUS W/TWL LRG LVL3 (GOWN DISPOSABLE) ×9
GOWN STRL REUS W/TWL XL LVL3 (GOWN DISPOSABLE) ×3
KIT BASIN OR (CUSTOM PROCEDURE TRAY) ×3 IMPLANT
KIT TURNOVER KIT B (KITS) ×3 IMPLANT
NS IRRIG 1000ML POUR BTL (IV SOLUTION) ×3 IMPLANT
PACK CV ACCESS (CUSTOM PROCEDURE TRAY) ×3 IMPLANT
PAD ARMBOARD 7.5X6 YLW CONV (MISCELLANEOUS) ×6 IMPLANT
PENCIL BUTTON HOLSTER BLD 10FT (ELECTRODE) ×3 IMPLANT
SUT MNCRL AB 4-0 PS2 18 (SUTURE) ×9 IMPLANT
SUT PROLENE 6 0 BV (SUTURE) ×3 IMPLANT
SUT SILK 2 0 SH (SUTURE) ×3 IMPLANT
SUT VIC AB 3-0 SH 27 (SUTURE) ×9
SUT VIC AB 3-0 SH 27X BRD (SUTURE) ×3 IMPLANT
TOWEL GREEN STERILE (TOWEL DISPOSABLE) ×3 IMPLANT
UNDERPAD 30X30 (UNDERPADS AND DIAPERS) ×3 IMPLANT
WATER STERILE IRR 1000ML POUR (IV SOLUTION) ×3 IMPLANT

## 2019-06-25 NOTE — Discharge Instructions (Signed)
° °  [facility] ° °Discharge Instructions ° °AV Fistula or Graft Surgery for Dialysis Access ° °Please refer to the following instructions for your post-procedure care. Your surgeon or physician assistant will discuss any changes with you. ° °Activity ° °You may drive the day following your surgery, if you are comfortable and no longer taking prescription pain medication. Resume full activity as the soreness in your incision resolves. ° °Bathing/Showering ° °You may shower after you go home. Keep your incision dry for 48 hours. Do not soak in a bathtub, hot tub, or swim until the incision heals completely. You may not shower if you have a hemodialysis catheter. ° °Incision Care ° °Clean your incision with mild soap and water after 48 hours. Pat the area dry with a clean towel. You do not need a bandage unless otherwise instructed. Do not apply any ointments or creams to your incision. You may have skin glue on your incision. Do not peel it off. It will come off on its own in about one week. Your arm may swell a bit after surgery. To reduce swelling use pillows to elevate your arm so it is above your heart. Your doctor will tell you if you need to lightly wrap your arm with an ACE bandage. ° °Diet ° °Resume your normal diet. There are not special food restrictions following this procedure. In order to heal from your surgery, it is CRITICAL to get adequate nutrition. Your body requires vitamins, minerals, and protein. Vegetables are the best source of vitamins and minerals. Vegetables also provide the perfect balance of protein. Processed food has little nutritional value, so try to avoid this. ° °Medications ° °Resume taking all of your medications. If your incision is causing pain, you may take over-the counter pain relievers such as acetaminophen (Tylenol). If you were prescribed a stronger pain medication, please be aware these medications can cause nausea and constipation. Prevent  nausea by taking the medication with a snack or meal. Avoid constipation by drinking plenty of fluids and eating foods with high amount of fiber, such as fruits, vegetables, and grains. Do not take Tylenol if you are taking prescription pain medications. ° ° ° ° °Follow up °Your surgeon may want to see you in the office following your access surgery. If so, this will be arranged at the time of your surgery. ° °Please call us immediately for any of the following conditions: ° °Increased pain, redness, drainage (pus) from your incision site °Fever of 101 degrees or higher °Severe or worsening pain at your incision site °Hand pain or numbness. ° °Reduce your risk of vascular disease: ° °Stop smoking. If you would like help, call [facility] at 1-800-QUIT-NOW (1-800-784-8669) or [place] at 336-586-4000 ° °Manage your cholesterol °Maintain a desired weight °Control your diabetes °Keep your blood pressure down ° °Dialysis ° °It will take several weeks to several months for your new dialysis access to be ready for use. Your surgeon will determine when it is OK to use it. Your nephrologist will continue to direct your dialysis. You can continue to use your Permcath until your new access is ready for use. ° °If you have any questions, please call the office at [phone]. ° °

## 2019-06-25 NOTE — Anesthesia Postprocedure Evaluation (Signed)
Anesthesia Post Note  Patient: Kerri Brown  Procedure(s) Performed: BASCILIC VEIN TRANSPOSITION LEFT ARM (Left Arm Upper)     Patient location during evaluation: PACU Anesthesia Type: MAC Level of consciousness: awake and alert Pain management: pain level controlled Vital Signs Assessment: post-procedure vital signs reviewed and stable Respiratory status: spontaneous breathing, nonlabored ventilation, respiratory function stable and patient connected to nasal cannula oxygen Cardiovascular status: stable and blood pressure returned to baseline Postop Assessment: no apparent nausea or vomiting Anesthetic complications: no Comments: MSK chest pain, EKG SB, BP stable, no diaphoresis. CP resolved prior to discharge.     Last Vitals:  Vitals:   06/25/19 1350 06/25/19 1406  BP: (!) 107/54 (!) 105/57  Pulse: (!) 49 (!) 51  Resp: 14 19  Temp:    SpO2: 98% 100%                 Effie Berkshire

## 2019-06-25 NOTE — Transfer of Care (Signed)
Immediate Anesthesia Transfer of Care Note  Patient: Kerri Brown  Procedure(s) Performed: BASCILIC VEIN TRANSPOSITION LEFT ARM (Left Arm Upper)  Patient Location: PACU  Anesthesia Type:MAC  Level of Consciousness: awake  Airway & Oxygen Therapy: Patient Spontanous Breathing  Post-op Assessment: Report given to RN and Post -op Vital signs reviewed and stable  Post vital signs: Reviewed and stable  Last Vitals:  Vitals Value Taken Time  BP 99/48 06/25/19 1304  Temp    Pulse    Resp 15 06/25/19 1306  SpO2    Vitals shown include unvalidated device data.  Last Pain:  Vitals:   06/25/19 0757  TempSrc:   PainSc: 0-No pain         Complications: No apparent anesthesia complications

## 2019-06-25 NOTE — Op Note (Signed)
    Patient name: Kerri Brown MRN: [id number] DOB: 1946/01/23 Sex: female  06/25/2019 Pre-operative Diagnosis: Chronic kidney disease Post-operative diagnosis:  Same Surgeon:  Eda Paschal. Donzetta Matters, MD Assistant: Risa Grill, PA Procedure Performed:   Revision of left arm basilic vein fistula with transposition  Indications: 74 year old female with history of chronic kidney disease.  She has not yet initiated dialysis.  She has undergone for stage basilic vein fistula on the left she is now indicated for transposition.  Findings: Fistula measured approximately 1 cm throughout most of its course.  There was 1 area that was quite sclerotic just after the anastomosis and we divided it in that area.  Occlusion was a very strong thrill in the fistula and a palpable radial artery pulse the wrist confirmed with Doppler.   Procedure:  The patient was identified in the holding area and taken to the operating where she is placed supine on the operating table and MAC anesthesia induced.  She was gently prepped and draped in the left upper extremity usual fashion antibiotics were administered timeout was called.  We began using 1% lidocaine with epinephrine throughout the upper arm overlying the palpable thrill and also the expected tunnel tract.  We open the previous incision dissected down to the fistula.  There is one area that was quite sclerotic.  We protected the nerve throughout its course.  We then made 2 separate skip incisions dissecting out the entirety of the fistula dividing branches between between clips and ties.  We then marked it for orientation.  We clamped it near the anastomosis and transected it.  We then flushed with heparinized saline and tunneled it laterally maintaining orientation.  We then spatulated both ends.  It was sewn end-to-end with 6-0 Prolene suture.  Prior to completion we let flushing all directions.  Upon completion there was a very strong thrill in the fistula there was a  palpable radial artery pulse the wrist these were both confirmed with Doppler.  Satisfied with this we obtain hemostasis and irrigated the wounds and closed in layers of Vicryl and Monocryl.  She was awake from anesthesia having tolerated procedure without any complication.  Counts were correct occlusion.  EBL: 50 cc   Enrico Eaddy C. Donzetta Matters, MD [facility] Office: [phone] Pager: [phone]

## 2019-06-25 NOTE — H&P (Signed)
   History and Physical Update  The patient was interviewed and re-examined.  The patient's previous History and Physical has been reviewed and is unchanged from recent office visit. Plan for left arm basilic vein transposition.   Carilyn Woolston C. Donzetta Matters, MD [facility] [place] [facility] Office: [phone] Pager: [phone]

## 2019-07-17 ENCOUNTER — Ambulatory Visit (INDEPENDENT_AMBULATORY_CARE_PROVIDER_SITE_OTHER): Payer: Self-pay | Admitting: Physician Assistant

## 2019-07-17 ENCOUNTER — Other Ambulatory Visit: Payer: Self-pay

## 2019-07-17 VITALS — BP 114/78 | HR 128 | Temp 97.2°F | Resp 20 | Ht 67.0 in | Wt 167.5 lb

## 2019-07-17 DIAGNOSIS — N184 Chronic kidney disease, stage 4 (severe): Secondary | ICD-10-CM

## 2019-07-17 NOTE — Progress Notes (Addendum)
    Postoperative Access Visit   History of Present Illness   Kerri Brown is a 74 y.o. year old female who presents for postoperative follow-up for: left second stage basilic vein transposition and revision on 06/25/19 by Dr. Donzetta Matters. The patient's wounds are healing well.  The patient notes numbness and coldness of her forearm and hand consistent with steal symptoms.  The patient is not able to complete their activities of daily living. She says she can barely tolerate doing yard or house works since her surgery because of how her arm feels. She has additionally noticed some swelling of upper arm. Has improved some with elevation. She has some tenderness in the upper arm but denies any pain in left arm or hand. She is presently still not on HD. She sees Nephrologist Dr. Theador Hawthorne in [place].   Physical Examination   Vitals:   07/17/19 0842  BP: 114/78  Pulse: (!) 128  Resp: 20  Temp: (!) 97.2 F (36.2 C)  TempSrc: Temporal  SpO2: 97%  Weight: 167 lb 8 oz (76 kg)  Height: 5\' 7"  (1.702 m)   Body mass index is 26.23 kg/m. General: well nourished, in some discomfort but no acute distress Lungs: slightly short of breath, lungs clear to auscultation bilaterally Cardiac: tachycardic, irregularly irregular rhythm Left arm: Incisions in left upper arm are healing well, clean, dry and intact. Dermabond skin adhesive in place. 1+ left radial pulse, hand grip is 5/5, sensation in digits is intact, palpable thrill, bruit can  be auscultated. Mild edema of medial left upper arm. No ecchymosis. Minimally tender    Medical Decision Making   Kerri Brown is a 74 y.o. year old female who presents s/p left single stage basilic vein transposition and revision 06/25/19.   Patent with signs or symptoms of steal syndrome- coldness and numbness of left forearm and hand  The patient's access will be ready for use after 07/21/19  She is presently not on HD, she will follow up with Nephrologist on  5/12 for determination of HD initiation  She has symptomatic PAF, I advised her to follow up with her cardiologist at her earliest convenience   Because of patients steal symptoms I have scheduled her to follow up in 4-6 weeks. Presently  Her symptoms are tolerable. I have discussed with her if they become intolerable she will need a ligation. She understands this and will follow up sooner if her symptoms worsen   Karoline Caldwell, PA-C [facility] Office: [phone]  Clinic MD: Dr. Donzetta Matters

## 2019-07-21 NOTE — Progress Notes (Addendum)
Cardiology Office Note  Date: 07/22/2019   ID: Kerri Brown Jul 07, 1945, MRN [id number]  PCP:  Kerri Labrum, MD  Cardiologist:  Kerri Sable, MD Electrophysiologist:  None   Chief Complaint: Follow-up PAF, hypertension, hyperlipidemia, GERD  History of Present Illness: Kerri Brown is a 74 y.o. female with a history of PAF, hypertension, hyperlipidemia, GERD  She was last seen by Kerri Brown on November 04, 2018.  She denied any anginal or exertional symptoms, palpitations, orthostatic symptoms, orthopnea, PND, syncope, or lower extremity edema.    She was performing active outdoor work and carrying tree limbs and twisted her lower back and had some lower back pain at that time.  She was symptomatically stable with PAF on metoprolol.  Her blood pressure was elevated during that visit and benazepril was increased to 40 mg a day.  She was continuing pravastatin and omeprazole.  She has stage IV renal disease and sees Kerri Brown in [place].  Recent AV fistula placement for dialysis access.  She presents today with complaints of increasing shortness of breath.  Her heart rate is 100 when checked by the nurse.  She has a history of PAF.  She recently had an AV fistula placed with Kerri Brown vascular surgeon in anticipation of HD.  She denies any anginal symptoms, orthostatic symptoms, bleeding issues, PND, orthopnea, or lower extremity edema.  Past Medical History:  Diagnosis Date  . Anemia   . Arthritis   . Bronchitis    hx  . CKD (chronic kidney disease)    Stage 5 due to DM  . Constipation    occasional  . Diabetes ([place])    type 2  . GERD (gastroesophageal reflux disease)   . Hyperlipemia   . Hypertension   . PAF (paroxysmal atrial fibrillation) ([place])   . Upper limb arterial embolus ([place]) 04/11/2019   RUE, s/p thromboembolectomy ([facility])    Past Surgical History:  Procedure Laterality Date  . AV FISTULA PLACEMENT Left 04/23/2019   Procedure: LEFT  ARM ARTERIOVENOUS (AV) FISTULA CREATION;  Surgeon: Kerri Sandy, MD;  Location: [facility];  Service: Vascular;  Laterality: Left;  . BASCILIC VEIN TRANSPOSITION Left 06/25/2019   Procedure: BASCILIC VEIN TRANSPOSITION LEFT ARM;  Surgeon: Kerri Sandy, MD;  Location: [facility];  Service: Vascular;  Laterality: Left;  . BRACHIAL EMBOLECTOMY Right 04/11/2019  . COLONOSCOPY    . KNEE ARTHROSCOPY Right   . LAPAROSCOPIC CHOLECYSTECTOMY    . TOTAL HIP ARTHROPLASTY Right     Current Outpatient Medications  Medication Sig Dispense Refill  . acetaminophen (TYLENOL) 500 MG tablet Take 500-1,000 mg by mouth every 6 (six) hours as needed for moderate pain or headache.    . allopurinol (ZYLOPRIM) 100 MG tablet Take 100 mg by mouth at bedtime.     . cholecalciferol (VITAMIN D) 1000 units tablet Take 1,000 Units by mouth daily.    . Ferrous Gluconate (IRON) 240 (27 Fe) MG TABS Take 240 mg by mouth daily.    . furosemide (LASIX) 40 MG tablet Take 40 mg by mouth 2 (two) times daily.     Marland Kitchen LANTUS SOLOSTAR 100 UNIT/ML Solostar Pen Inject 45 Units into the skin daily.     Marland Kitchen omeprazole (PRILOSEC) 20 MG capsule Take 1 capsule by mouth once daily (Patient taking differently: Take 20 mg by mouth daily. ) 90 capsule 1  . oxyCODONE-acetaminophen (PERCOCET) 10-325 MG tablet Take 1 tablet by mouth every 6 (six) hours as needed  for pain. 20 tablet 0  . rosuvastatin (CRESTOR) 10 MG tablet Take 10 mg by mouth every Friday.     . sodium bicarbonate 650 MG tablet Take 650 mg by mouth 3 (three) times daily.    Marland Kitchen warfarin (COUMADIN) 5 MG tablet Take 1 tablet (5 mg total) by mouth daily. (Patient taking differently: Take 5 mg by mouth every evening. ) 30 tablet 1  . metoprolol tartrate (LOPRESSOR) 50 MG tablet Take 1 tablet (50 mg total) by mouth 2 (two) times daily. 180 tablet 3   No current facility-administered medications for this visit.   Allergies:  Patient has no known allergies.   Social History: The  patient  reports that she quit smoking about 5 years ago. Her smoking use included cigarettes. She started smoking about 49 years ago. She has a 22.00 pack-year smoking history. She has never used smokeless tobacco. She reports previous alcohol use. She reports that she does not use drugs.   Family History: The patient's family history includes Diabetes in her father, sister, sister, and another family member; Heart disease in her father.   ROS:  Please see the history of present illness. Otherwise, complete review of systems is positive for none.  All other systems are reviewed and negative.   Physical Exam: VS:  BP 134/80   Pulse 100   Ht 5\' 7"  (1.702 m)   Wt 166 lb 3.2 oz (75.4 kg)   LMP  (LMP Unknown)   SpO2 93%   BMI 26.03 kg/m , BMI Body mass index is 26.03 kg/m.  Wt Readings from Last 3 Encounters:  07/22/19 166 lb 3.2 oz (75.4 kg)  07/17/19 167 lb 8 oz (76 kg)  06/25/19 162 lb (73.5 kg)    General: Patient appears comfortable at rest. Neck: Supple, no elevated JVP or carotid bruits, no thyromegaly. Lungs: Clear to auscultation, nonlabored breathing at rest. Cardiac: Irregular irregular tachycardic  rhythm, no S3 or significant systolic murmur, no pericardial rub. Extremities: No pitting edema, distal pulses 2+. Skin: Warm and dry. Musculoskeletal: No kyphosis. Neuropsychiatric: Alert and oriented x3, affect grossly appropriate.  ECG:  An ECG dated 07/22/2019 was personally reviewed today and demonstrated:  Atrial fibrillation with rapid ventricular response rate of 119, LAD, RBBB  Recent Labwork: 06/25/2019: BUN 55; Creatinine, Ser 3.60; Hemoglobin 11.2; Potassium 4.5; Sodium 142  No results found for: CHOL, TRIG, HDL, CHOLHDL, VLDL, LDLCALC, LDLDIRECT  Other Studies Reviewed Today:  Recent CT scan for lung cancer screening ordered by PCP  1. Lung-RADS 4B, suspicious. Complex subpleural cystic pulmonary  nodule in the anterior basilar right lower lobe with thick  irregular  wall measuring approximately 22.6 mm in volume derived mean  diameter. Follow up low-dose chest CT without contrast in3 months  (please use the following order, "CT CHEST LCS NODULE FOLLOW-UP /O  CM") is recommended. Additional imaging evaluation or consultation  with Pulmonology or Thoracic Surgery recommended.  2. Small dependent bilateral pleural effusions, left greater than  right.  3. Dilated main pulmonary artery, suggesting pulmonary arterial  hypertension.  4. Nonspecific mild right paratracheal lymphadenopathy, which can  also be reassessed on follow-up chest CT.  5. Left main and 1 vessel coronary atherosclerosis.  6. Aortic Atherosclerosis (ICD10-I70.0) and Emphysema (ICD10-J43.9).   These results will be called to the ordering clinician or  representative by the Radiologist Assistant, and communication  documented in the PACS or [employer].      Echocardiogram 2017 Study Conclusions - Left ventricle: The cavity  size was normal. Wall thickness was  increased in a pattern of mild LVH. Systolic function was normal.  The estimated ejection fraction was in the range of 60% to 65%.  Wall motion was normal; there were no regional wall motion  abnormalities. Features are consistent with a pseudonormal left  ventricular filling pattern, with concomitant abnormal relaxation  and increased filling pressure (grade 2 diastolic dysfunction).  - Mitral valve: There was trivial regurgitation.  - Left atrium: The atrium was mildly dilated.  - Right atrium: Central venous pressure (est): 3 mm Hg.  - Atrial septum: The septum was thickened.  - Tricuspid valve: There was trivial regurgitation.  - Pulmonary arteries: PA peak pressure: 15 mm Hg (S).  - Pericardium, extracardiac: There was no pericardial effusion.   Impressions:   - Mild LVH with LVEF 60-65%. Grade 2 diastolic dysfunction. Mild  left atrial enlargement. Trivial mitral and tricuspid    regurgitation. Thickened interatrial septum. PASP estimated 15  mmHg.   Assessment and Plan:  1. Paroxysmal atrial fibrillation (HCC)   2. Essential hypertension   3. Mixed hyperlipidemia   4. Chronic kidney disease (CKD), stage IV (severe) (HCC)   5. Persistent atrial fibrillation ([place])   6. Diastolic dysfunction    1. Paroxysmal atrial fibrillation (HCC) EKG today shows atrial fibrillation with RVR with a rate of 119.  Increase metoprolol to 50 mg p.o. twice daily.  Follow-up in 1 week for nursing visit and EKG to see if heart rate has slowed down and shortness of breath has improved.  Continue warfarin 5 mg p.o. daily.  2. Essential hypertension Blood pressure 134/80.  Continue Lasix 40 mg p.o. twice daily  3. Mixed hyperlipidemia Continue Crestor 10 mg by mouth every Friday.  4. Chronic kidney disease (CKD), stage IV (severe) (HCC) Patient recently had an AV fistula placed by vascular surgery.  She has a positive thrill and bruit and left upper extremity.  She is anticipating starting hemodialysis soon secondary to end-stage renal disease.  Sees Kerri Brown.  5.  Diastolic dysfunction Get a repeat echocardiogram for diastolic dysfunction.  Recently patient having increasing shortness of breath.  Last echo showed grade 2 diastolic dysfunction in 7106.  Medication Adjustments/Labs and Tests Ordered: Current medicines are reviewed at length with the patient today.  Concerns regarding medicines are outlined above.   Disposition: Follow-up with Kerri Brown or APP  Signed, Levell July, NP 07/22/2019 9:16 AM    [facility] [place], [place], [place] [address] Phone: [phone]; Fax: [phone]

## 2019-07-22 ENCOUNTER — Encounter: Payer: Self-pay | Admitting: Family Medicine

## 2019-07-22 ENCOUNTER — Other Ambulatory Visit: Payer: Self-pay

## 2019-07-22 ENCOUNTER — Telehealth: Payer: Self-pay | Admitting: Family Medicine

## 2019-07-22 ENCOUNTER — Ambulatory Visit: Payer: Medicare HMO | Admitting: Family Medicine

## 2019-07-22 VITALS — BP 134/80 | HR 100 | Ht 67.0 in | Wt 166.2 lb

## 2019-07-22 DIAGNOSIS — I4819 Other persistent atrial fibrillation: Secondary | ICD-10-CM

## 2019-07-22 DIAGNOSIS — I5189 Other ill-defined heart diseases: Secondary | ICD-10-CM

## 2019-07-22 DIAGNOSIS — N184 Chronic kidney disease, stage 4 (severe): Secondary | ICD-10-CM

## 2019-07-22 DIAGNOSIS — I10 Essential (primary) hypertension: Secondary | ICD-10-CM | POA: Diagnosis not present

## 2019-07-22 DIAGNOSIS — E782 Mixed hyperlipidemia: Secondary | ICD-10-CM

## 2019-07-22 DIAGNOSIS — I480 Paroxysmal atrial fibrillation: Secondary | ICD-10-CM | POA: Diagnosis not present

## 2019-07-22 MED ORDER — METOPROLOL TARTRATE 50 MG PO TABS: 50.0000 mg | ORAL_TABLET | Freq: Two times a day (BID) | ORAL | 3 refills | Status: DC

## 2019-07-22 NOTE — Patient Instructions (Addendum)
Medication Instructions:   Your physician has recommended you make the following change in your medication:   Increase metoprolol tartrate to 50 mg by mouth twice daily. You may take (2) of your 25 mg tablets twice daily until they are finished.  Continue other medications the same  Labwork:    Testing/Procedures: Your physician has requested that you have an echocardiogram. Echocardiography is a painless test that uses sound waves to create images of your heart. It provides your doctor with information about the size and shape of your heart and how well your heart's chambers and valves are working. This procedure takes approximately one hour. There are no restrictions for this procedure.  Follow-Up:  Your physician recommends that you schedule a follow-up appointment in: 2 months (office).  Your physician recommends that you schedule a follow-up appointment in: 2 weeks for a nurse visit to have an EKG.  Any Other Special Instructions Will Be Listed Below (If Applicable).   If you need a refill on your cardiac medications before your next appointment, please call your pharmacy.

## 2019-07-22 NOTE — Telephone Encounter (Signed)
  Precert needed for: 2 D Echo dx: diastolic dysfunction   Location: [facility]    Date: Jul 23, 2019

## 2019-07-23 ENCOUNTER — Ambulatory Visit (INDEPENDENT_AMBULATORY_CARE_PROVIDER_SITE_OTHER): Payer: Medicare HMO

## 2019-07-23 DIAGNOSIS — E872 Acidosis: Secondary | ICD-10-CM | POA: Diagnosis not present

## 2019-07-23 DIAGNOSIS — E559 Vitamin D deficiency, unspecified: Secondary | ICD-10-CM | POA: Diagnosis not present

## 2019-07-23 DIAGNOSIS — I129 Hypertensive chronic kidney disease with stage 1 through stage 4 chronic kidney disease, or unspecified chronic kidney disease: Secondary | ICD-10-CM | POA: Diagnosis not present

## 2019-07-23 DIAGNOSIS — E1122 Type 2 diabetes mellitus with diabetic chronic kidney disease: Secondary | ICD-10-CM | POA: Diagnosis not present

## 2019-07-23 DIAGNOSIS — N185 Chronic kidney disease, stage 5: Secondary | ICD-10-CM | POA: Diagnosis not present

## 2019-07-23 DIAGNOSIS — E1129 Type 2 diabetes mellitus with other diabetic kidney complication: Secondary | ICD-10-CM | POA: Diagnosis not present

## 2019-07-23 DIAGNOSIS — I5189 Other ill-defined heart diseases: Secondary | ICD-10-CM

## 2019-07-23 DIAGNOSIS — R809 Proteinuria, unspecified: Secondary | ICD-10-CM | POA: Diagnosis not present

## 2019-07-23 DIAGNOSIS — E211 Secondary hyperparathyroidism, not elsewhere classified: Secondary | ICD-10-CM | POA: Diagnosis not present

## 2019-07-28 ENCOUNTER — Telehealth: Payer: Self-pay | Admitting: *Deleted

## 2019-07-28 NOTE — Telephone Encounter (Signed)
-----   Message from Verta Ellen., NP sent at 07/24/2019 12:30 PM EDT ----- Please call the patient and tell her the echo results showed her hearts pumping function is normal. Her LV is a little thick and muscular and best treatment is managing her blood pressure well. She has one mildly leaking valve which is unchanged from her previous echocardiogram in 2017.

## 2019-07-29 DIAGNOSIS — I517 Cardiomegaly: Secondary | ICD-10-CM | POA: Diagnosis not present

## 2019-07-29 DIAGNOSIS — E872 Acidosis: Secondary | ICD-10-CM | POA: Diagnosis not present

## 2019-07-29 DIAGNOSIS — R809 Proteinuria, unspecified: Secondary | ICD-10-CM | POA: Diagnosis not present

## 2019-07-29 DIAGNOSIS — I129 Hypertensive chronic kidney disease with stage 1 through stage 4 chronic kidney disease, or unspecified chronic kidney disease: Secondary | ICD-10-CM | POA: Diagnosis not present

## 2019-07-29 DIAGNOSIS — E211 Secondary hyperparathyroidism, not elsewhere classified: Secondary | ICD-10-CM | POA: Diagnosis not present

## 2019-07-29 DIAGNOSIS — E870 Hyperosmolality and hypernatremia: Secondary | ICD-10-CM | POA: Diagnosis not present

## 2019-07-29 DIAGNOSIS — I770 Arteriovenous fistula, acquired: Secondary | ICD-10-CM | POA: Diagnosis not present

## 2019-07-29 DIAGNOSIS — N185 Chronic kidney disease, stage 5: Secondary | ICD-10-CM | POA: Diagnosis not present

## 2019-07-29 DIAGNOSIS — D631 Anemia in chronic kidney disease: Secondary | ICD-10-CM | POA: Diagnosis not present

## 2019-07-29 NOTE — Telephone Encounter (Signed)
Patient informed. Copy sent to PCP °

## 2019-08-05 ENCOUNTER — Other Ambulatory Visit: Payer: Self-pay

## 2019-08-05 ENCOUNTER — Ambulatory Visit (INDEPENDENT_AMBULATORY_CARE_PROVIDER_SITE_OTHER): Payer: Medicare HMO | Admitting: *Deleted

## 2019-08-05 DIAGNOSIS — I480 Paroxysmal atrial fibrillation: Secondary | ICD-10-CM

## 2019-08-05 DIAGNOSIS — E782 Mixed hyperlipidemia: Secondary | ICD-10-CM | POA: Diagnosis not present

## 2019-08-05 DIAGNOSIS — R5383 Other fatigue: Secondary | ICD-10-CM | POA: Diagnosis not present

## 2019-08-05 DIAGNOSIS — Z13220 Encounter for screening for lipoid disorders: Secondary | ICD-10-CM | POA: Diagnosis not present

## 2019-08-05 DIAGNOSIS — E119 Type 2 diabetes mellitus without complications: Secondary | ICD-10-CM | POA: Diagnosis not present

## 2019-08-05 DIAGNOSIS — I10 Essential (primary) hypertension: Secondary | ICD-10-CM | POA: Diagnosis not present

## 2019-08-05 DIAGNOSIS — E875 Hyperkalemia: Secondary | ICD-10-CM | POA: Diagnosis not present

## 2019-08-05 DIAGNOSIS — E1122 Type 2 diabetes mellitus with diabetic chronic kidney disease: Secondary | ICD-10-CM | POA: Diagnosis not present

## 2019-08-05 NOTE — Progress Notes (Signed)
Pt here for EKG per recent office appt - increased metoprolol 50 mg bid pt has been taking this without problems - pt c/o weakness and some SOB on exertion/palpitations - EKG done and will forward to provider BP 110/60 - per Katina Dung, NP who reviewed EKG pt will increase Metoprolol 100 mg bid and nurse visit scheduled for repeat EKG on 6/3

## 2019-08-07 DIAGNOSIS — Z6825 Body mass index (BMI) 25.0-25.9, adult: Secondary | ICD-10-CM | POA: Diagnosis not present

## 2019-08-07 DIAGNOSIS — E1122 Type 2 diabetes mellitus with diabetic chronic kidney disease: Secondary | ICD-10-CM | POA: Diagnosis not present

## 2019-08-07 DIAGNOSIS — I10 Essential (primary) hypertension: Secondary | ICD-10-CM | POA: Diagnosis not present

## 2019-08-07 DIAGNOSIS — E782 Mixed hyperlipidemia: Secondary | ICD-10-CM | POA: Diagnosis not present

## 2019-08-07 DIAGNOSIS — Z87891 Personal history of nicotine dependence: Secondary | ICD-10-CM | POA: Diagnosis not present

## 2019-08-07 DIAGNOSIS — N186 End stage renal disease: Secondary | ICD-10-CM | POA: Diagnosis not present

## 2019-08-07 DIAGNOSIS — I4819 Other persistent atrial fibrillation: Secondary | ICD-10-CM | POA: Diagnosis not present

## 2019-08-07 DIAGNOSIS — I5032 Chronic diastolic (congestive) heart failure: Secondary | ICD-10-CM | POA: Diagnosis not present

## 2019-08-20 ENCOUNTER — Ambulatory Visit: Payer: Medicare HMO

## 2019-08-20 DIAGNOSIS — F17210 Nicotine dependence, cigarettes, uncomplicated: Secondary | ICD-10-CM | POA: Diagnosis not present

## 2019-08-20 DIAGNOSIS — I700 Atherosclerosis of aorta: Secondary | ICD-10-CM | POA: Diagnosis not present

## 2019-08-20 DIAGNOSIS — R911 Solitary pulmonary nodule: Secondary | ICD-10-CM | POA: Diagnosis not present

## 2019-08-20 DIAGNOSIS — Z87891 Personal history of nicotine dependence: Secondary | ICD-10-CM | POA: Diagnosis not present

## 2019-08-20 DIAGNOSIS — R590 Localized enlarged lymph nodes: Secondary | ICD-10-CM | POA: Diagnosis not present

## 2019-08-20 DIAGNOSIS — I288 Other diseases of pulmonary vessels: Secondary | ICD-10-CM | POA: Diagnosis not present

## 2019-08-20 DIAGNOSIS — J439 Emphysema, unspecified: Secondary | ICD-10-CM | POA: Diagnosis not present

## 2019-08-20 DIAGNOSIS — J90 Pleural effusion, not elsewhere classified: Secondary | ICD-10-CM | POA: Diagnosis not present

## 2019-08-20 DIAGNOSIS — I2510 Atherosclerotic heart disease of native coronary artery without angina pectoris: Secondary | ICD-10-CM | POA: Diagnosis not present

## 2019-08-21 ENCOUNTER — Other Ambulatory Visit: Payer: Self-pay

## 2019-08-21 ENCOUNTER — Ambulatory Visit (INDEPENDENT_AMBULATORY_CARE_PROVIDER_SITE_OTHER): Payer: Self-pay | Admitting: Physician Assistant

## 2019-08-21 DIAGNOSIS — N184 Chronic kidney disease, stage 4 (severe): Secondary | ICD-10-CM

## 2019-08-21 NOTE — Progress Notes (Signed)
    Postoperative Access Visit   History of Present Illness   Kerri Brown is a 74 y.o. year old female who presents for postoperative follow-up for: left single stage basilic vein transposition by Dr. Donzetta Matters (Date: 06/25/19).  The patient's wounds are healed.  Main complaint is of numbness along her forearm.  She denies any numbness or pain in her L hand.  She has occasional cramping in L hand but this is tolerable.  She is not yet on HD and states her kidney function has improved some based on last office visit with her Nephrologist.   Physical Examination   Vitals:   08/21/19 0902  BP: 123/83  Pulse: 77  Resp: 16  Temp: (!) 97.2 F (36.2 C)  TempSrc: Temporal  SpO2: 98%  Weight: 166 lb (75.3 kg)  Height: 5\' 7"  (1.702 m)   Body mass index is 26 kg/m.  left arm Incisions are healed, palpable radial pulse, hand grip is 5/5, sensation in digits is intact, palpable thrill, bruit can be auscultated; numbness along forearm     Medical Decision Making   Kerri Brown is a 74 y.o. year old female who presents s/p left single stage basilic vein transposition   Patent basilic vein fistula with palpable thrill  Her hand is well perfused with a palpable radial pulse; her forearm numbness is not consistent with steal and will likely not resolve if fistula was ligated;  Patient states symptoms are tolerable  The patient's access will be ready for use 09/24/19  The patient may follow up on a prn basis   Dagoberto Ligas PA-C [facility] Office: [phone]  Clinic MD: Donzetta Matters

## 2019-08-24 ENCOUNTER — Other Ambulatory Visit: Payer: Self-pay

## 2019-08-24 ENCOUNTER — Ambulatory Visit (INDEPENDENT_AMBULATORY_CARE_PROVIDER_SITE_OTHER): Payer: Medicare HMO | Admitting: *Deleted

## 2019-08-24 DIAGNOSIS — I480 Paroxysmal atrial fibrillation: Secondary | ICD-10-CM | POA: Diagnosis not present

## 2019-08-24 MED ORDER — DILTIAZEM HCL ER COATED BEADS 120 MG PO CP24: 120.0000 mg | ORAL_CAPSULE | Freq: Every day | ORAL | 1 refills | Status: DC

## 2019-08-24 NOTE — Progress Notes (Signed)
Per Katina Dung, NP start Cardizem 120 mg daily and 1 week nurse appt for repeat EKG

## 2019-08-24 NOTE — Progress Notes (Signed)
Per recent nurse appt - pt increased Metoprolol 100 mg bid - still c/o SOB and fatigue - BP today is 108/70 HR per EKG 121 - will forward to provider

## 2019-08-24 NOTE — Patient Instructions (Signed)
Your physician recommends that you schedule a follow-up appointment in: [facility] VISIT EKG  Your physician has recommended you make the following change in your medication:   START CARDIZEM 120 MG DAILY   Thank you for choosing [facility]!!

## 2019-08-31 ENCOUNTER — Ambulatory Visit (INDEPENDENT_AMBULATORY_CARE_PROVIDER_SITE_OTHER): Payer: Medicare HMO | Admitting: *Deleted

## 2019-08-31 DIAGNOSIS — I480 Paroxysmal atrial fibrillation: Secondary | ICD-10-CM

## 2019-08-31 NOTE — Progress Notes (Addendum)
Presents for nurse visit for EKG per last nurse visit. Medications reviewed. Reports continued SOB that is has gradually gotten worse every day. Reports dizziness when bending over. Denies chest pain.   Please increase Cardizem to 240 mg daily. Refer to atrial fibrillation clinic

## 2019-09-01 ENCOUNTER — Telehealth: Payer: Self-pay | Admitting: *Deleted

## 2019-09-01 MED ORDER — DILTIAZEM HCL ER COATED BEADS 240 MG PO CP24: 240.0000 mg | ORAL_CAPSULE | Freq: Every day | ORAL | 6 refills | Status: DC

## 2019-09-01 NOTE — Telephone Encounter (Signed)
Patient informed and verbalized understanding of plan. 

## 2019-09-01 NOTE — Progress Notes (Signed)
Verta Ellen., NP  Merlene Laughter, RN Increase Cardizem CD to 240 mg daily and refer to Atrial fibrillation clinic

## 2019-09-01 NOTE — Telephone Encounter (Signed)
Called and spoke with patient.  She is aware of appt 09/01/19 @1 :30 pm with [facility].

## 2019-09-01 NOTE — Telephone Encounter (Signed)
-----   Message from Verta Ellen., NP sent at 09/01/2019  7:53 AM EDT ----- Increase Cardizem CD to 240 mg daily and refer to Atrial fibrillation clinic

## 2019-09-02 ENCOUNTER — Ambulatory Visit (HOSPITAL_COMMUNITY)
Admission: RE | Admit: 2019-09-02 | Discharge: 2019-09-02 | Disposition: A | Discharge status: Home / Self Care | Payer: Medicare HMO | Source: Ambulatory Visit | Attending: Nurse Practitioner | Admitting: Nurse Practitioner

## 2019-09-02 ENCOUNTER — Encounter (HOSPITAL_COMMUNITY): Payer: Self-pay | Admitting: Nurse Practitioner

## 2019-09-02 ENCOUNTER — Other Ambulatory Visit: Payer: Self-pay

## 2019-09-02 ENCOUNTER — Ambulatory Visit: Payer: Self-pay | Admitting: *Deleted

## 2019-09-02 VITALS — BP 106/66 | HR 82 | Ht 67.0 in | Wt 173.6 lb

## 2019-09-02 DIAGNOSIS — R0600 Dyspnea, unspecified: Secondary | ICD-10-CM | POA: Insufficient documentation

## 2019-09-02 DIAGNOSIS — D649 Anemia, unspecified: Secondary | ICD-10-CM | POA: Insufficient documentation

## 2019-09-02 DIAGNOSIS — I4891 Unspecified atrial fibrillation: Secondary | ICD-10-CM

## 2019-09-02 DIAGNOSIS — N186 End stage renal disease: Secondary | ICD-10-CM | POA: Diagnosis not present

## 2019-09-02 DIAGNOSIS — R0602 Shortness of breath: Secondary | ICD-10-CM | POA: Diagnosis not present

## 2019-09-02 DIAGNOSIS — Z833 Family history of diabetes mellitus: Secondary | ICD-10-CM | POA: Diagnosis not present

## 2019-09-02 DIAGNOSIS — M1990 Unspecified osteoarthritis, unspecified site: Secondary | ICD-10-CM | POA: Diagnosis not present

## 2019-09-02 DIAGNOSIS — R42 Dizziness and giddiness: Secondary | ICD-10-CM | POA: Diagnosis not present

## 2019-09-02 DIAGNOSIS — Z5181 Encounter for therapeutic drug level monitoring: Secondary | ICD-10-CM

## 2019-09-02 DIAGNOSIS — D6869 Other thrombophilia: Secondary | ICD-10-CM | POA: Diagnosis not present

## 2019-09-02 DIAGNOSIS — J9811 Atelectasis: Secondary | ICD-10-CM | POA: Diagnosis not present

## 2019-09-02 DIAGNOSIS — K219 Gastro-esophageal reflux disease without esophagitis: Secondary | ICD-10-CM | POA: Diagnosis not present

## 2019-09-02 DIAGNOSIS — E1122 Type 2 diabetes mellitus with diabetic chronic kidney disease: Secondary | ICD-10-CM | POA: Diagnosis not present

## 2019-09-02 DIAGNOSIS — Z87891 Personal history of nicotine dependence: Secondary | ICD-10-CM | POA: Insufficient documentation

## 2019-09-02 DIAGNOSIS — Z8249 Family history of ischemic heart disease and other diseases of the circulatory system: Secondary | ICD-10-CM | POA: Insufficient documentation

## 2019-09-02 DIAGNOSIS — R079 Chest pain, unspecified: Secondary | ICD-10-CM | POA: Diagnosis not present

## 2019-09-02 DIAGNOSIS — I4819 Other persistent atrial fibrillation: Secondary | ICD-10-CM | POA: Insufficient documentation

## 2019-09-02 DIAGNOSIS — J90 Pleural effusion, not elsewhere classified: Secondary | ICD-10-CM | POA: Diagnosis not present

## 2019-09-02 DIAGNOSIS — Z794 Long term (current) use of insulin: Secondary | ICD-10-CM | POA: Insufficient documentation

## 2019-09-02 DIAGNOSIS — E785 Hyperlipidemia, unspecified: Secondary | ICD-10-CM | POA: Diagnosis not present

## 2019-09-02 DIAGNOSIS — Z79899 Other long term (current) drug therapy: Secondary | ICD-10-CM | POA: Insufficient documentation

## 2019-09-02 DIAGNOSIS — I120 Hypertensive chronic kidney disease with stage 5 chronic kidney disease or end stage renal disease: Secondary | ICD-10-CM | POA: Insufficient documentation

## 2019-09-02 DIAGNOSIS — Z7901 Long term (current) use of anticoagulants: Secondary | ICD-10-CM | POA: Insufficient documentation

## 2019-09-02 DIAGNOSIS — I770 Arteriovenous fistula, acquired: Secondary | ICD-10-CM | POA: Diagnosis not present

## 2019-09-02 LAB — CBC
HCT: 37.2 % (ref 36.0–46.0)
Hemoglobin: 12 g/dL (ref 12.0–15.0)
MCH: 28.2 pg (ref 26.0–34.0)
MCHC: 32.3 g/dL (ref 30.0–36.0)
MCV: 87.5 fL (ref 80.0–100.0)
Platelets: 238 10*3/uL (ref 150–400)
RBC: 4.25 MIL/uL (ref 3.87–5.11)
RDW: 17.2 % — ABNORMAL HIGH (ref 11.5–15.5)
WBC: 10.4 10*3/uL (ref 4.0–10.5)
nRBC: 0 % (ref 0.0–0.2)

## 2019-09-02 LAB — COMPREHENSIVE METABOLIC PANEL
ALT: 43 U/L (ref 0–44)
AST: 28 U/L (ref 15–41)
Albumin: 3.1 g/dL — ABNORMAL LOW (ref 3.5–5.0)
Alkaline Phosphatase: 57 U/L (ref 38–126)
Anion gap: 13 (ref 5–15)
BUN: 56 mg/dL — ABNORMAL HIGH (ref 8–23)
CO2: 23 mmol/L (ref 22–32)
Calcium: 8.4 mg/dL — ABNORMAL LOW (ref 8.9–10.3)
Chloride: 103 mmol/L (ref 98–111)
Creatinine, Ser: 3.52 mg/dL — ABNORMAL HIGH (ref 0.44–1.00)
GFR calc Af Amer: 14 mL/min — ABNORMAL LOW (ref >60)
GFR calc non Af Amer: 12 mL/min — ABNORMAL LOW (ref >60)
Glucose, Bld: 102 mg/dL — ABNORMAL HIGH (ref 70–99)
Potassium: 4.6 mmol/L (ref 3.5–5.1)
Sodium: 139 mmol/L (ref 135–145)
Total Bilirubin: 0.5 mg/dL (ref 0.3–1.2)
Total Protein: 6 g/dL — ABNORMAL LOW (ref 6.5–8.1)

## 2019-09-02 LAB — PROTIME-INR
INR: 5.6 (ref 0.8–1.2)
Prothrombin Time: 48.9 seconds — ABNORMAL HIGH (ref 11.4–15.2)

## 2019-09-02 LAB — TSH: TSH: 3.06 u[IU]/mL (ref 0.350–4.500)

## 2019-09-02 MED ORDER — AMIODARONE HCL 200 MG PO TABS
ORAL_TABLET | ORAL | 0 refills | Status: DC
Start: 2019-09-02 — End: 2019-09-30

## 2019-09-02 MED ORDER — METOPROLOL TARTRATE 25 MG PO TABS
25.0000 mg | ORAL_TABLET | Freq: Two times a day (BID) | ORAL | 3 refills | Status: DC
Start: 2019-09-02 — End: 2019-11-19

## 2019-09-02 MED ORDER — DILTIAZEM HCL ER COATED BEADS 120 MG PO CP24
120.0000 mg | ORAL_CAPSULE | Freq: Every day | ORAL | 3 refills | Status: DC
Start: 2019-09-02 — End: 2019-10-08

## 2019-09-02 NOTE — Patient Instructions (Signed)
Starting Amiodarone 200mg  bid 09/03/19 for poss DCCV Hold warfarin x 3 days (W,Th,F) then take 2.5mg  (1/2 tablet) on Saturday and Sunday and come for INR check on Monday 6/21. Instructions given to [facility] in AFib Clinic that will call pt.

## 2019-09-02 NOTE — Progress Notes (Addendum)
Primary Care Physician: Curlene Labrum, MD Referring Physician: Levell July NP   Kerri Brown is a 75 y.o. female with a h/o PAF, ESRD that is being seen for persistent afib burden. She had a recent AV fistula placement for dialysis, initially in April and that is when the pt states since then her afib has persisted. She has h/o of onset of paroxysmal afib since 2017.  L fistula access placed  in anticipation of HD, she is on warfarin with a CHA2DS2VASc score of  at least 4. SHe was on DOAC prior to that and developed a blood clot in her rt brachial artery as she was cutting back on the drug due to price. BB was recently added and  increased to 100 mg bid and cardizem 120 mg was increased to 240 mg  daily added but pt has not picked up form the drugstore. She is rate controlled today. In afib she is very fatigued and dizzy and weak, She  states that she can not carry on like this. She did trip in the kitchen yesterday and hit her left ear on a drawer pull and bruised her rt flank.    Today, she denies symptoms of palpitations, chest pain, shortness of breath, orthopnea, PND, lower extremity edema, dizziness, presyncope, syncope, or neurologic sequela. The patient is tolerating medications without difficulties and is otherwise without complaint today.   Past Medical History:  Diagnosis Date  . Anemia   . Arthritis   . Bronchitis    hx  . CKD (chronic kidney disease)    Stage 5 due to DM  . Constipation    occasional  . Diabetes ([place])    type 2  . GERD (gastroesophageal reflux disease)   . Hyperlipemia   . Hypertension   . PAF (paroxysmal atrial fibrillation) ([place])   . Upper limb arterial embolus ([place]) 04/11/2019   RUE, s/p thromboembolectomy ([facility])   Past Surgical History:  Procedure Laterality Date  . AV FISTULA PLACEMENT Left 04/23/2019   Procedure: LEFT ARM ARTERIOVENOUS (AV) FISTULA CREATION;  Surgeon: Waynetta Sandy, MD;  Location: [facility];  Service:  Vascular;  Laterality: Left;  . BASCILIC VEIN TRANSPOSITION Left 06/25/2019   Procedure: BASCILIC VEIN TRANSPOSITION LEFT ARM;  Surgeon: Waynetta Sandy, MD;  Location: [facility];  Service: Vascular;  Laterality: Left;  . BRACHIAL EMBOLECTOMY Right 04/11/2019  . COLONOSCOPY    . KNEE ARTHROSCOPY Right   . LAPAROSCOPIC CHOLECYSTECTOMY    . TOTAL HIP ARTHROPLASTY Right     Current Outpatient Medications  Medication Sig Dispense Refill  . acetaminophen (TYLENOL) 500 MG tablet Take 500-1,000 mg by mouth every 6 (six) hours as needed for moderate pain or headache.    . allopurinol (ZYLOPRIM) 100 MG tablet Take 100 mg by mouth at bedtime.     . cholecalciferol (VITAMIN D) 1000 units tablet Take 1,000 Units by mouth daily.    Marland Kitchen diltiazem (CARDIZEM CD) 240 MG 24 hr capsule Take 1 capsule (240 mg total) by mouth daily. 30 capsule 6  . Ferrous Gluconate (IRON) 240 (27 Fe) MG TABS Take 240 mg by mouth daily.    . furosemide (LASIX) 40 MG tablet Take 40 mg by mouth 2 (two) times daily.     Marland Kitchen LANTUS SOLOSTAR 100 UNIT/ML Solostar Pen Inject 45 Units into the skin daily.     . metoprolol tartrate (LOPRESSOR) 50 MG tablet Take 1 tablet (50 mg total) by mouth 2 (two) times daily. 180 tablet  3  . omeprazole (PRILOSEC) 20 MG capsule Take 1 capsule by mouth once daily (Patient taking differently: Take 20 mg by mouth daily. ) 90 capsule 1  . oxyCODONE-acetaminophen (PERCOCET) 10-325 MG tablet Take 1 tablet by mouth every 6 (six) hours as needed for pain. (Patient not taking: Reported on 08/21/2019) 20 tablet 0  . rosuvastatin (CRESTOR) 10 MG tablet Take 10 mg by mouth every Friday.     . sodium bicarbonate 650 MG tablet Take 650 mg by mouth 3 (three) times daily.    Marland Kitchen warfarin (COUMADIN) 5 MG tablet Take 1 tablet (5 mg total) by mouth daily. (Patient taking differently: Take 5 mg by mouth every evening. ) 30 tablet 1   No current facility-administered medications for this encounter.    No Known  Allergies  Social History   Socioeconomic History  . Marital status: Widowed    Spouse name: Not on file  . Number of children: Not on file  . Years of education: Not on file  . Highest education level: Not on file  Occupational History  . Not on file  Tobacco Use  . Smoking status: Former Smoker    Packs/day: 0.50    Years: 44.00    Pack years: 22.00    Types: Cigarettes    Start date: 05/04/1970    Quit date: 05/04/2014    Years since quitting: 5.3  . Smokeless tobacco: Never Used  Vaping Use  . Vaping Use: Never used  Substance and Sexual Activity  . Alcohol use: Not Currently    Alcohol/week: 0.0 standard drinks  . Drug use: Never  . Sexual activity: Not on file  Other Topics Concern  . Not on file  Social History Narrative  . Not on file   Social Determinants of Health   Financial Resource Strain:   . Difficulty of Paying Living Expenses:   Food Insecurity:   . Worried About Charity fundraiser in the Last Year:   . Arboriculturist in the Last Year:   Transportation Needs:   . Film/video editor (Medical):   Marland Kitchen Lack of Transportation (Non-Medical):   Physical Activity:   . Days of Exercise per Week:   . Minutes of Exercise per Session:   Stress:   . Feeling of Stress :   Social Connections:   . Frequency of Communication with Friends and Family:   . Frequency of Social Gatherings with Friends and Family:   . Attends Religious Services:   . Active Member of Clubs or Organizations:   . Attends Archivist Meetings:   Marland Kitchen Marital Status:   Intimate Partner Violence:   . Fear of Current or Ex-Partner:   . Emotionally Abused:   Marland Kitchen Physically Abused:   . Sexually Abused:     Family History  Problem Relation Age of Onset  . Heart disease Father        had a pacemaker  . Diabetes Father   . Diabetes Sister   . Diabetes Sister   . Diabetes Other     ROS- All systems are reviewed and negative except as per the HPI above  Physical  Exam: There were no vitals filed for this visit. Wt Readings from Last 3 Encounters:  08/21/19 75.3 kg  07/22/19 75.4 kg  07/17/19 76 kg    Labs: Lab Results  Component Value Date   NA 142 06/25/2019   K 4.5 06/25/2019   CL 109 06/25/2019   CO2 29  08/07/2010   GLUCOSE 83 06/25/2019   BUN 55 (H) 06/25/2019   CREATININE 3.60 (H) 06/25/2019   CALCIUM 9.2 08/07/2010   Lab Results  Component Value Date   INR 2.2 (H) 06/25/2019   No results found for: CHOL, HDL, LDLCALC, TRIG   GEN- The patient is well appearing, alert and oriented x 3 today.   Head- normocephalic, atraumatic Eyes-  Sclera clear, conjunctiva pink Ears- hearing intact, bruising left ear  Oropharynx- clear Neck- supple, no JVP Lymph- no cervical lymphadenopathy Lungs- Clear to ausculation bilaterally, normal work of breathing Heart- irregular rate and rhythm, no murmurs, rubs or gallops, PMI not laterally displaced GI- soft, NT, ND, + BS, bruising rt flank  Extremities- no clubbing, cyanosis, or edema MS- no significant deformity or atrophy Skin- no rash or lesion Psych- euthymic mood, full affect Neuro- strength and sensation are intact  EKG-afib at 82 bpm qrs int 110 ms, qtc 436 ms  Echo- 1. Left ventricular ejection fraction, by estimation, is 55 to 60%. The  left ventricle has normal function. The left ventricle has no regional  wall motion abnormalities. There is mild left ventricular hypertrophy of  the posterior segment. Left  ventricular diastolic parameters are indeterminate. Elevated left  ventricular end-diastolic pressure.  2. Right ventricular systolic function is mildly reduced. The right  ventricular size is normal. There is normal pulmonary artery systolic  pressure.  3. Left atrial size was mildly dilated.  4. The mitral valve is grossly normal. Mild mitral valve regurgitation.  5. The aortic valve is tricuspid. Aortic valve regurgitation is not  visualized. No aortic stenosis is  present.  6. The inferior vena cava is normal in size with greater than 50%  respiratory variability, suggesting right atrial pressure of 3 mmHg.   Comparison(s): Echocardiogram done 3/0/17 showed an EF of 54%.   Conclusion(s)/Recommendation(s): Patient was tachycardic throughout the  study. Consider contrast-enhancement at time of next echocardiogram for a  more accurate assessment of regional wall motion.   FINDINGS  Left Ventricle: Left ventricular ejection fraction, by estimation, is 55  to 60%. The left ventricle has normal function. The left ventricle has no  regional wall motion abnormalities. The left ventricular internal cavity  size was normal in size. There is  mild left ventricular hypertrophy of the posterior segment. Left  ventricular diastolic parameters are indeterminate. Elevated left  ventricular end-diastolic pressure.   Assessment and Plan: 1. Persistent afib:  She is symptomatic with afib for several  months She is rate controlled General discussion re afib Discussed with her dialysis being imminent, there is only one antiarrythmic that I could use to try to restore SR,  with that being amiodarone.   I discussed risk vrs benefit of drug I  will load amio 200 mg bid and then proceed to cardioversion after one month and after one month of weekly therapeutic  INR's I will get CBC, TSH , CMET and INR today  Also will get a baseline CXR for amio start  I will keep Metoprolol at 50 mg bid and Cardizem at 120 mg bid with loading of amiodarone as for its slowing effect   Lab called the lab for an urgent INR of 5.6 , pt is unclear who is following coumadin and no recent INR's in Epic  We discussed with Kerri Brown at [facility] cardiology who will address the INR issues, looks like no one has checked INR in some time   I will see back in one week  Geroge Baseman Taher Vannote, [facility] [address] [place], [place] [address] [phone]

## 2019-09-02 NOTE — Addendum Note (Signed)
Encounter addended by: Sherran Needs, NP on: 09/02/2019 4:09 PM  Actions taken: Clinical Note Signed

## 2019-09-02 NOTE — Patient Instructions (Signed)
Decrease amiodarone 200mg  twice a day for 1 month then reduce to 1 tablet a day  Decrease metoprolol to 25mg  twice a day  Continue Cardizem 120mg  once a day

## 2019-09-03 NOTE — Progress Notes (Signed)
Patient notified of recommendations. Verbalized understanding.

## 2019-09-07 ENCOUNTER — Other Ambulatory Visit: Payer: Self-pay

## 2019-09-07 ENCOUNTER — Ambulatory Visit (INDEPENDENT_AMBULATORY_CARE_PROVIDER_SITE_OTHER): Payer: Medicare HMO | Admitting: *Deleted

## 2019-09-07 DIAGNOSIS — Z5181 Encounter for therapeutic drug level monitoring: Secondary | ICD-10-CM | POA: Diagnosis not present

## 2019-09-07 DIAGNOSIS — I4819 Other persistent atrial fibrillation: Secondary | ICD-10-CM | POA: Diagnosis not present

## 2019-09-07 LAB — POCT INR: INR: 2.3 (ref 2.0–3.0)

## 2019-09-07 NOTE — Patient Instructions (Addendum)
Pending DCCV  (1st pre DCCV) Started Amiodarone 200mg  bid 09/03/19  Start warfarin 1/2 tablet daily except 1 tablet on Mondays and Fridays Recheck in 1 week

## 2019-09-09 ENCOUNTER — Other Ambulatory Visit: Payer: Self-pay

## 2019-09-09 ENCOUNTER — Ambulatory Visit (HOSPITAL_COMMUNITY)
Admission: RE | Admit: 2019-09-09 | Discharge: 2019-09-09 | Disposition: A | Discharge status: Home / Self Care | Payer: Medicare HMO | Source: Ambulatory Visit | Attending: Nurse Practitioner | Admitting: Nurse Practitioner

## 2019-09-09 VITALS — BP 120/68 | HR 90 | Ht 67.0 in | Wt 168.2 lb

## 2019-09-09 DIAGNOSIS — T45516D Underdosing of anticoagulants, subsequent encounter: Secondary | ICD-10-CM | POA: Diagnosis not present

## 2019-09-09 DIAGNOSIS — I517 Cardiomegaly: Secondary | ICD-10-CM | POA: Insufficient documentation

## 2019-09-09 DIAGNOSIS — S7011XA Contusion of right thigh, initial encounter: Secondary | ICD-10-CM | POA: Diagnosis not present

## 2019-09-09 DIAGNOSIS — E785 Hyperlipidemia, unspecified: Secondary | ICD-10-CM | POA: Diagnosis not present

## 2019-09-09 DIAGNOSIS — D649 Anemia, unspecified: Secondary | ICD-10-CM | POA: Insufficient documentation

## 2019-09-09 DIAGNOSIS — S00432A Contusion of left ear, initial encounter: Secondary | ICD-10-CM | POA: Insufficient documentation

## 2019-09-09 DIAGNOSIS — Z91120 Patient's intentional underdosing of medication regimen due to financial hardship: Secondary | ICD-10-CM | POA: Insufficient documentation

## 2019-09-09 DIAGNOSIS — E1122 Type 2 diabetes mellitus with diabetic chronic kidney disease: Secondary | ICD-10-CM | POA: Diagnosis not present

## 2019-09-09 DIAGNOSIS — Y92000 Kitchen of unspecified non-institutional (private) residence as  the place of occurrence of the external cause: Secondary | ICD-10-CM | POA: Diagnosis not present

## 2019-09-09 DIAGNOSIS — I120 Hypertensive chronic kidney disease with stage 5 chronic kidney disease or end stage renal disease: Secondary | ICD-10-CM | POA: Diagnosis not present

## 2019-09-09 DIAGNOSIS — N186 End stage renal disease: Secondary | ICD-10-CM | POA: Insufficient documentation

## 2019-09-09 DIAGNOSIS — Z87891 Personal history of nicotine dependence: Secondary | ICD-10-CM | POA: Insufficient documentation

## 2019-09-09 DIAGNOSIS — Z833 Family history of diabetes mellitus: Secondary | ICD-10-CM | POA: Insufficient documentation

## 2019-09-09 DIAGNOSIS — W1840XA Slipping, tripping and stumbling without falling, unspecified, initial encounter: Secondary | ICD-10-CM | POA: Insufficient documentation

## 2019-09-09 DIAGNOSIS — Z992 Dependence on renal dialysis: Secondary | ICD-10-CM | POA: Insufficient documentation

## 2019-09-09 DIAGNOSIS — I4819 Other persistent atrial fibrillation: Secondary | ICD-10-CM | POA: Insufficient documentation

## 2019-09-09 DIAGNOSIS — I480 Paroxysmal atrial fibrillation: Secondary | ICD-10-CM | POA: Diagnosis not present

## 2019-09-09 DIAGNOSIS — D6869 Other thrombophilia: Secondary | ICD-10-CM | POA: Diagnosis not present

## 2019-09-09 DIAGNOSIS — Z794 Long term (current) use of insulin: Secondary | ICD-10-CM | POA: Diagnosis not present

## 2019-09-09 DIAGNOSIS — K219 Gastro-esophageal reflux disease without esophagitis: Secondary | ICD-10-CM | POA: Diagnosis not present

## 2019-09-09 DIAGNOSIS — Z79899 Other long term (current) drug therapy: Secondary | ICD-10-CM | POA: Diagnosis not present

## 2019-09-09 DIAGNOSIS — Z7901 Long term (current) use of anticoagulants: Secondary | ICD-10-CM | POA: Diagnosis not present

## 2019-09-09 NOTE — Progress Notes (Signed)
Primary Care Physician: Curlene Labrum, MD Referring Physician: Levell July NP   Kerri Brown is a 74 y.o. female with a h/o PAF, ESRD that is being seen for persistent afib burden. She had a recent AV fistula placement for dialysis, initially in April and that is when the pt states since then her afib has persisted. She has h/o of onset of paroxysmal afib since 2017.  L fistula access placed  in anticipation of HD, she is on warfarin with a CHA2DS2VASc score of  at least 4. SHe was on DOAC prior to that and developed a blood clot in her rt brachial artery as she was cutting back on the drug due to price. BB was recently added and  increased to 100 mg bid and cardizem 120 mg was increased to 240 mg  daily added but pt has not picked up form the drugstore. She is rate controlled today. In afib she is very fatigued and dizzy and weak, She  states that she can not carry on like this. She did trip in the kitchen yesterday and hit her left ear on a drawer pull and bruised her rt flank.   F/u in afib clinic, 6/22. On last visit here we found her INR to be over 5. She  had not had her coumadin checked in some time. She was notified to hold her coumadin for several nights and is now seeing [name] in [place] for her INR and  2 days ago was 2.3. She is feeling much better now starting amiodarone and is rate controlled.   Today, she denies symptoms of palpitations, chest pain, shortness of breath, orthopnea, PND, lower extremity edema, dizziness, presyncope, syncope, or neurologic sequela. The patient is tolerating medications without difficulties and is otherwise without complaint today.   Past Medical History:  Diagnosis Date  . Anemia   . Arthritis   . Bronchitis    hx  . CKD (chronic kidney disease)    Stage 5 due to DM  . Constipation    occasional  . Diabetes ([place])    type 2  . GERD (gastroesophageal reflux disease)   . Hyperlipemia   . Hypertension   . PAF (paroxysmal atrial  fibrillation) ([place])   . Upper limb arterial embolus ([place]) 04/11/2019   RUE, s/p thromboembolectomy ([facility])   Past Surgical History:  Procedure Laterality Date  . AV FISTULA PLACEMENT Left 04/23/2019   Procedure: LEFT ARM ARTERIOVENOUS (AV) FISTULA CREATION;  Surgeon: Waynetta Sandy, MD;  Location: [facility];  Service: Vascular;  Laterality: Left;  . BASCILIC VEIN TRANSPOSITION Left 06/25/2019   Procedure: BASCILIC VEIN TRANSPOSITION LEFT ARM;  Surgeon: Waynetta Sandy, MD;  Location: [facility];  Service: Vascular;  Laterality: Left;  . BRACHIAL EMBOLECTOMY Right 04/11/2019  . COLONOSCOPY    . KNEE ARTHROSCOPY Right   . LAPAROSCOPIC CHOLECYSTECTOMY    . TOTAL HIP ARTHROPLASTY Right     Current Outpatient Medications  Medication Sig Dispense Refill  . acetaminophen (TYLENOL) 500 MG tablet Take 500-1,000 mg by mouth as needed for moderate pain or headache.     . allopurinol (ZYLOPRIM) 100 MG tablet Take 100 mg by mouth at bedtime.     Marland Kitchen amiodarone (PACERONE) 200 MG tablet Take 1 tablet by mouth twice a day for 1 month then reduce to 1 tablet a day 60 tablet 0  . calcium acetate (PHOSLO) 667 MG capsule Take 667 mg by mouth in the morning and at bedtime.     Marland Kitchen  cholecalciferol (VITAMIN D) 1000 units tablet Take 1,000 Units by mouth daily.    Marland Kitchen diltiazem (CARDIZEM CD) 120 MG 24 hr capsule Take 1 capsule (120 mg total) by mouth daily. 30 capsule 3  . Ferrous Gluconate (IRON) 240 (27 Fe) MG TABS Take 240 mg by mouth daily.    . furosemide (LASIX) 40 MG tablet Take 40 mg by mouth 2 (two) times daily.     Marland Kitchen LANTUS SOLOSTAR 100 UNIT/ML Solostar Pen Inject 45 Units into the skin daily.     . metoprolol tartrate (LOPRESSOR) 25 MG tablet Take 1 tablet (25 mg total) by mouth 2 (two) times daily. 180 tablet 3  . omeprazole (PRILOSEC) 20 MG capsule Take 1 capsule by mouth once daily (Patient taking differently: Take 20 mg by mouth daily. ) 90 capsule 1  . rosuvastatin (CRESTOR) 10 MG tablet  Take 10 mg by mouth every Friday.     . sodium bicarbonate 650 MG tablet Take 650 mg by mouth 3 (three) times daily.    Marland Kitchen warfarin (COUMADIN) 5 MG tablet Take 1 tablet (5 mg total) by mouth daily. (Patient taking differently: Take 2.5 mg by mouth every evening. ) 30 tablet 1   No current facility-administered medications for this encounter.    No Known Allergies  Social History   Socioeconomic History  . Marital status: Widowed    Spouse name: Not on file  . Number of children: Not on file  . Years of education: Not on file  . Highest education level: Not on file  Occupational History  . Not on file  Tobacco Use  . Smoking status: Former Smoker    Packs/day: 0.50    Years: 44.00    Pack years: 22.00    Types: Cigarettes    Start date: 05/04/1970    Quit date: 05/04/2014    Years since quitting: 5.3  . Smokeless tobacco: Never Used  Vaping Use  . Vaping Use: Never used  Substance and Sexual Activity  . Alcohol use: Never    Alcohol/week: 0.0 standard drinks  . Drug use: Never  . Sexual activity: Not on file  Other Topics Concern  . Not on file  Social History Narrative  . Not on file   Social Determinants of Health   Financial Resource Strain:   . Difficulty of Paying Living Expenses:   Food Insecurity:   . Worried About Charity fundraiser in the Last Year:   . Arboriculturist in the Last Year:   Transportation Needs:   . Film/video editor (Medical):   Marland Kitchen Lack of Transportation (Non-Medical):   Physical Activity:   . Days of Exercise per Week:   . Minutes of Exercise per Session:   Stress:   . Feeling of Stress :   Social Connections:   . Frequency of Communication with Friends and Family:   . Frequency of Social Gatherings with Friends and Family:   . Attends Religious Services:   . Active Member of Clubs or Organizations:   . Attends Archivist Meetings:   Marland Kitchen Marital Status:   Intimate Partner Violence:   . Fear of Current or Ex-Partner:     . Emotionally Abused:   Marland Kitchen Physically Abused:   . Sexually Abused:     Family History  Problem Relation Age of Onset  . Heart disease Father        had a pacemaker  . Diabetes Father   . Diabetes Sister   .  Diabetes Sister   . Diabetes Other     ROS- All systems are reviewed and negative except as per the HPI above  Physical Exam: Vitals:   09/09/19 1345  BP: 120/68  Pulse: 90  Weight: 76.3 kg  Height: 5\' 7"  (1.702 m)   Wt Readings from Last 3 Encounters:  09/09/19 76.3 kg  09/02/19 78.7 kg  08/21/19 75.3 kg    Labs: Lab Results  Component Value Date   NA 139 09/02/2019   K 4.6 09/02/2019   CL 103 09/02/2019   CO2 23 09/02/2019   GLUCOSE 102 (H) 09/02/2019   BUN 56 (H) 09/02/2019   CREATININE 3.52 (H) 09/02/2019   CALCIUM 8.4 (L) 09/02/2019   Lab Results  Component Value Date   INR 2.3 09/07/2019   No results found for: CHOL, HDL, LDLCALC, TRIG   GEN- The patient is well appearing, alert and oriented x 3 today.   Head- normocephalic, atraumatic Eyes-  Sclera clear, conjunctiva pink Ears- hearing intact, bruising left ear  Oropharynx- clear Neck- supple, no JVP Lymph- no cervical lymphadenopathy Lungs- Clear to ausculation bilaterally, normal work of breathing Heart- irregular rate and rhythm, no murmurs, rubs or gallops, PMI not laterally displaced GI- soft, NT, ND, + BS, bruising rt flank  Extremities- no clubbing, cyanosis, or edema MS- no significant deformity or atrophy Skin- no rash or lesion Psych- euthymic mood, full affect Neuro- strength and sensation are intact  EKG-afib at 90  bpm qrs int 118 ms, qtc 499 ms  Echo- 1. Left ventricular ejection fraction, by estimation, is 55 to 60%. The  left ventricle has normal function. The left ventricle has no regional  wall motion abnormalities. There is mild left ventricular hypertrophy of  the posterior segment. Left  ventricular diastolic parameters are indeterminate. Elevated left   ventricular end-diastolic pressure.  2. Right ventricular systolic function is mildly reduced. The right  ventricular size is normal. There is normal pulmonary artery systolic  pressure.  3. Left atrial size was mildly dilated.  4. The mitral valve is grossly normal. Mild mitral valve regurgitation.  5. The aortic valve is tricuspid. Aortic valve regurgitation is not  visualized. No aortic stenosis is present.  6. The inferior vena cava is normal in size with greater than 50%  respiratory variability, suggesting right atrial pressure of 3 mmHg.   Comparison(s): Echocardiogram done 3/0/17 showed an EF of 54%.   Conclusion(s)/Recommendation(s): Patient was tachycardic throughout the  study. Consider contrast-enhancement at time of next echocardiogram for a  more accurate assessment of regional wall motion.   FINDINGS  Left Ventricle: Left ventricular ejection fraction, by estimation, is 55  to 60%. The left ventricle has normal function. The left ventricle has no  regional wall motion abnormalities. The left ventricular internal cavity  size was normal in size. There is  mild left ventricular hypertrophy of the posterior segment. Left  ventricular diastolic parameters are indeterminate. Elevated left  ventricular end-diastolic pressure.   Assessment and Plan: 1. Persistent afib:  She is improved on amiodarone loading dose of 200 mg bid  She is rate controlled Discussed with her dialysis being imminent, there is only one antiarrythmic that I could use to try to restore SR,  with that being amiodarone.   I plan to  proceed to cardioversion after one month of loading on amiodarone  and after one month of weekly therapeutic  INR's Continue metoprolol at 25 mg bid and Cardizem at 120 mg daily  Continue weekly  INR checks  with [name] at [facility] cardiology   I will see back in 2 weeks  Butch Penny C. Zoiey Christy, [facility] [address] [place], [place]  [address] [phone]

## 2019-09-14 ENCOUNTER — Ambulatory Visit (INDEPENDENT_AMBULATORY_CARE_PROVIDER_SITE_OTHER): Payer: Medicare HMO | Admitting: *Deleted

## 2019-09-14 DIAGNOSIS — I4819 Other persistent atrial fibrillation: Secondary | ICD-10-CM

## 2019-09-14 DIAGNOSIS — Z5181 Encounter for therapeutic drug level monitoring: Secondary | ICD-10-CM | POA: Diagnosis not present

## 2019-09-14 LAB — POCT INR: INR: 2.6 (ref 2.0–3.0)

## 2019-09-14 NOTE — Patient Instructions (Signed)
Pending DCCV  (2nd pre DCCV) Started Amiodarone 200mg  bid 09/03/19  Continue warfarin 1/2 tablet daily except 1 tablet on Mondays and Fridays Recheck in 1 week

## 2019-09-21 ENCOUNTER — Emergency Department (HOSPITAL_COMMUNITY)
Admission: EM | Admit: 2019-09-21 | Discharge: 2019-09-21 | Disposition: A | Discharge status: Home / Self Care | Payer: Medicare HMO | Attending: Emergency Medicine | Admitting: Emergency Medicine

## 2019-09-21 ENCOUNTER — Other Ambulatory Visit: Payer: Self-pay

## 2019-09-21 ENCOUNTER — Emergency Department (HOSPITAL_COMMUNITY): Payer: Medicare HMO

## 2019-09-21 ENCOUNTER — Encounter (HOSPITAL_COMMUNITY): Payer: Self-pay | Admitting: *Deleted

## 2019-09-21 DIAGNOSIS — S93431A Sprain of tibiofibular ligament of right ankle, initial encounter: Secondary | ICD-10-CM | POA: Diagnosis not present

## 2019-09-21 DIAGNOSIS — S93401A Sprain of unspecified ligament of right ankle, initial encounter: Secondary | ICD-10-CM | POA: Diagnosis not present

## 2019-09-21 DIAGNOSIS — N184 Chronic kidney disease, stage 4 (severe): Secondary | ICD-10-CM | POA: Diagnosis not present

## 2019-09-21 DIAGNOSIS — M25551 Pain in right hip: Secondary | ICD-10-CM | POA: Diagnosis not present

## 2019-09-21 DIAGNOSIS — Z471 Aftercare following joint replacement surgery: Secondary | ICD-10-CM | POA: Diagnosis not present

## 2019-09-21 DIAGNOSIS — Z87891 Personal history of nicotine dependence: Secondary | ICD-10-CM | POA: Insufficient documentation

## 2019-09-21 DIAGNOSIS — E119 Type 2 diabetes mellitus without complications: Secondary | ICD-10-CM | POA: Diagnosis not present

## 2019-09-21 DIAGNOSIS — Z7901 Long term (current) use of anticoagulants: Secondary | ICD-10-CM | POA: Insufficient documentation

## 2019-09-21 DIAGNOSIS — W1839XA Other fall on same level, initial encounter: Secondary | ICD-10-CM | POA: Insufficient documentation

## 2019-09-21 DIAGNOSIS — Y999 Unspecified external cause status: Secondary | ICD-10-CM | POA: Diagnosis not present

## 2019-09-21 DIAGNOSIS — M25571 Pain in right ankle and joints of right foot: Secondary | ICD-10-CM | POA: Diagnosis not present

## 2019-09-21 DIAGNOSIS — I129 Hypertensive chronic kidney disease with stage 1 through stage 4 chronic kidney disease, or unspecified chronic kidney disease: Secondary | ICD-10-CM | POA: Insufficient documentation

## 2019-09-21 DIAGNOSIS — Z96641 Presence of right artificial hip joint: Secondary | ICD-10-CM | POA: Insufficient documentation

## 2019-09-21 DIAGNOSIS — Y939 Activity, unspecified: Secondary | ICD-10-CM | POA: Diagnosis not present

## 2019-09-21 DIAGNOSIS — Z794 Long term (current) use of insulin: Secondary | ICD-10-CM | POA: Diagnosis not present

## 2019-09-21 DIAGNOSIS — Z79899 Other long term (current) drug therapy: Secondary | ICD-10-CM | POA: Insufficient documentation

## 2019-09-21 DIAGNOSIS — S99921A Unspecified injury of right foot, initial encounter: Secondary | ICD-10-CM | POA: Diagnosis not present

## 2019-09-21 DIAGNOSIS — Y929 Unspecified place or not applicable: Secondary | ICD-10-CM | POA: Diagnosis not present

## 2019-09-21 DIAGNOSIS — W19XXXA Unspecified fall, initial encounter: Secondary | ICD-10-CM

## 2019-09-21 DIAGNOSIS — S79921A Unspecified injury of right thigh, initial encounter: Secondary | ICD-10-CM | POA: Diagnosis not present

## 2019-09-21 NOTE — Discharge Instructions (Signed)
Return if any problems.

## 2019-09-21 NOTE — ED Triage Notes (Signed)
Fell 13 days ago and fell again this am. Pain in right thigh area and right ankle and foot

## 2019-09-22 ENCOUNTER — Ambulatory Visit (INDEPENDENT_AMBULATORY_CARE_PROVIDER_SITE_OTHER): Payer: Medicare HMO | Admitting: *Deleted

## 2019-09-22 ENCOUNTER — Ambulatory Visit: Payer: Medicare HMO | Admitting: Family Medicine

## 2019-09-22 DIAGNOSIS — Z5181 Encounter for therapeutic drug level monitoring: Secondary | ICD-10-CM

## 2019-09-22 DIAGNOSIS — I4819 Other persistent atrial fibrillation: Secondary | ICD-10-CM | POA: Diagnosis not present

## 2019-09-22 LAB — POCT INR: INR: 4 — AB (ref 2.0–3.0)

## 2019-09-22 NOTE — Patient Instructions (Signed)
Pending DCCV  (3rd pre DCCV) Started Amiodarone 200mg  bid 09/03/19  Hold warfarin tonight then decrease dose to 1/2 tablet daily except 1 tablet on Mondays  Recheck in 1 week

## 2019-09-23 ENCOUNTER — Other Ambulatory Visit: Payer: Self-pay

## 2019-09-23 ENCOUNTER — Ambulatory Visit (HOSPITAL_COMMUNITY)
Admission: RE | Admit: 2019-09-23 | Discharge: 2019-09-23 | Disposition: A | Discharge status: Home / Self Care | Payer: Medicare HMO | Source: Ambulatory Visit | Attending: Nurse Practitioner | Admitting: Nurse Practitioner

## 2019-09-23 VITALS — BP 120/66 | HR 75 | Ht 67.0 in | Wt 159.2 lb

## 2019-09-23 DIAGNOSIS — N185 Chronic kidney disease, stage 5: Secondary | ICD-10-CM | POA: Insufficient documentation

## 2019-09-23 DIAGNOSIS — Z87891 Personal history of nicotine dependence: Secondary | ICD-10-CM | POA: Diagnosis not present

## 2019-09-23 DIAGNOSIS — Z7901 Long term (current) use of anticoagulants: Secondary | ICD-10-CM | POA: Insufficient documentation

## 2019-09-23 DIAGNOSIS — Z86711 Personal history of pulmonary embolism: Secondary | ICD-10-CM | POA: Diagnosis not present

## 2019-09-23 DIAGNOSIS — K5900 Constipation, unspecified: Secondary | ICD-10-CM | POA: Diagnosis not present

## 2019-09-23 DIAGNOSIS — Z96641 Presence of right artificial hip joint: Secondary | ICD-10-CM | POA: Diagnosis not present

## 2019-09-23 DIAGNOSIS — I4819 Other persistent atrial fibrillation: Secondary | ICD-10-CM

## 2019-09-23 DIAGNOSIS — Z794 Long term (current) use of insulin: Secondary | ICD-10-CM | POA: Diagnosis not present

## 2019-09-23 DIAGNOSIS — I770 Arteriovenous fistula, acquired: Secondary | ICD-10-CM | POA: Insufficient documentation

## 2019-09-23 DIAGNOSIS — E1122 Type 2 diabetes mellitus with diabetic chronic kidney disease: Secondary | ICD-10-CM | POA: Diagnosis not present

## 2019-09-23 DIAGNOSIS — I120 Hypertensive chronic kidney disease with stage 5 chronic kidney disease or end stage renal disease: Secondary | ICD-10-CM | POA: Diagnosis not present

## 2019-09-23 DIAGNOSIS — E785 Hyperlipidemia, unspecified: Secondary | ICD-10-CM | POA: Insufficient documentation

## 2019-09-23 DIAGNOSIS — K219 Gastro-esophageal reflux disease without esophagitis: Secondary | ICD-10-CM | POA: Insufficient documentation

## 2019-09-23 DIAGNOSIS — Z79899 Other long term (current) drug therapy: Secondary | ICD-10-CM | POA: Diagnosis not present

## 2019-09-23 DIAGNOSIS — M1990 Unspecified osteoarthritis, unspecified site: Secondary | ICD-10-CM | POA: Diagnosis not present

## 2019-09-23 LAB — CBC
HCT: 40.3 % (ref 36.0–46.0)
Hemoglobin: 12.6 g/dL (ref 12.0–15.0)
MCH: 27.9 pg (ref 26.0–34.0)
MCHC: 31.3 g/dL (ref 30.0–36.0)
MCV: 89.2 fL (ref 80.0–100.0)
Platelets: 243 10*3/uL (ref 150–400)
RBC: 4.52 MIL/uL (ref 3.87–5.11)
RDW: 16.6 % — ABNORMAL HIGH (ref 11.5–15.5)
WBC: 9.4 10*3/uL (ref 4.0–10.5)
nRBC: 0 % (ref 0.0–0.2)

## 2019-09-23 LAB — BASIC METABOLIC PANEL
Anion gap: 14 (ref 5–15)
BUN: 53 mg/dL — ABNORMAL HIGH (ref 8–23)
CO2: 24 mmol/L (ref 22–32)
Calcium: 8.8 mg/dL — ABNORMAL LOW (ref 8.9–10.3)
Chloride: 105 mmol/L (ref 98–111)
Creatinine, Ser: 4.02 mg/dL — ABNORMAL HIGH (ref 0.44–1.00)
GFR calc Af Amer: 12 mL/min — ABNORMAL LOW (ref >60)
GFR calc non Af Amer: 10 mL/min — ABNORMAL LOW (ref >60)
Glucose, Bld: 73 mg/dL (ref 70–99)
Potassium: 3.8 mmol/L (ref 3.5–5.1)
Sodium: 143 mmol/L (ref 135–145)

## 2019-09-23 NOTE — H&P (View-Only) (Signed)
Primary Care Physician: Curlene Labrum, MD Referring Physician: Levell July NP   Kerri Brown is a 74 y.o. female with a h/o PAF, ESRD that is being seen for persistent afib burden. She had a recent AV fistula placement for dialysis, initially in April and that is when the pt states since then her afib has persisted. She has h/o of onset of paroxysmal afib since 2017.  L fistula access placed  in anticipation of HD, she is on warfarin with a CHA2DS2VASc score of  at least 4. SHe was on DOAC prior to that and developed a blood clot in her rt brachial artery as she was cutting back on the drug due to price. BB was recently added and  increased to 100 mg bid and cardizem 120 mg was increased to 240 mg  daily added but pt has not picked up form the drugstore. She is rate controlled today. In afib she is very fatigued and dizzy and weak, She  states that she can not carry on like this. She did trip in the kitchen yesterday and hit her left ear on a drawer pull and bruised her rt flank.   F/u in afib clinic, 6/22. On last visit here we found her INR to be over 5. She  had not had her coumadin checked in some time. She was notified to hold her coumadin for several nights and is now seeing [name] in [place] for her INR and  2 days ago was 2.3. She is feeling much better now starting amiodarone and is rate controlled.   F/u 7/7. She had another fall a couple of days ago. She got her walker tangled up in a throw rug. She  was seen in the ED and dx with a rt foot sprain. She did not have LOC.  She has now had 4 therapeutic INR's and will now schedule  for a cardioversion as she has been loading on amiodarone approaching a month.  Today, she denies symptoms of palpitations, chest pain, shortness of breath, orthopnea, PND, lower extremity edema, dizziness, presyncope, syncope, or neurologic sequela. The patient is tolerating medications without difficulties and is otherwise without complaint today.   Past  Medical History:  Diagnosis Date  . Anemia   . Arthritis   . Bronchitis    hx  . CKD (chronic kidney disease)    Stage 5 due to DM  . Constipation    occasional  . Diabetes ([place])    type 2  . GERD (gastroesophageal reflux disease)   . Hyperlipemia   . Hypertension   . PAF (paroxysmal atrial fibrillation) ([place])   . Upper limb arterial embolus ([place]) 04/11/2019   RUE, s/p thromboembolectomy ([facility])   Past Surgical History:  Procedure Laterality Date  . AV FISTULA PLACEMENT Left 04/23/2019   Procedure: LEFT ARM ARTERIOVENOUS (AV) FISTULA CREATION;  Surgeon: Waynetta Sandy, MD;  Location: [place];  Service: Vascular;  Laterality: Left;  . BASCILIC VEIN TRANSPOSITION Left 06/25/2019   Procedure: BASCILIC VEIN TRANSPOSITION LEFT ARM;  Surgeon: Waynetta Sandy, MD;  Location: [place];  Service: Vascular;  Laterality: Left;  . BRACHIAL EMBOLECTOMY Right 04/11/2019  . COLONOSCOPY    . KNEE ARTHROSCOPY Right   . LAPAROSCOPIC CHOLECYSTECTOMY    . TOTAL HIP ARTHROPLASTY Right     Current Outpatient Medications  Medication Sig Dispense Refill  . acetaminophen (TYLENOL) 500 MG tablet Take 500-1,000 mg by mouth as needed for moderate pain or headache.     Marland Kitchen  allopurinol (ZYLOPRIM) 100 MG tablet Take 100 mg by mouth at bedtime.     Marland Kitchen amiodarone (PACERONE) 200 MG tablet Take 1 tablet by mouth twice a day for 1 month then reduce to 1 tablet a day 60 tablet 0  . calcium acetate (PHOSLO) 667 MG capsule Take 667 mg by mouth in the morning and at bedtime.     . cholecalciferol (VITAMIN D) 1000 units tablet Take 1,000 Units by mouth daily.    Marland Kitchen diltiazem (CARDIZEM CD) 120 MG 24 hr capsule Take 1 capsule (120 mg total) by mouth daily. 30 capsule 3  . Ferrous Gluconate (IRON) 240 (27 Fe) MG TABS Take 240 mg by mouth daily.    . furosemide (LASIX) 40 MG tablet Take 40 mg by mouth 2 (two) times daily.     Marland Kitchen LANTUS SOLOSTAR 100 UNIT/ML Solostar Pen Inject 45 Units into the skin daily.       . metoprolol tartrate (LOPRESSOR) 25 MG tablet Take 1 tablet (25 mg total) by mouth 2 (two) times daily. 180 tablet 3  . omeprazole (PRILOSEC) 20 MG capsule Take 1 capsule by mouth once daily (Patient taking differently: Take 20 mg by mouth daily. ) 90 capsule 1  . rosuvastatin (CRESTOR) 10 MG tablet Take 10 mg by mouth every Friday.     . sodium bicarbonate 650 MG tablet Take 650 mg by mouth 3 (three) times daily.    Marland Kitchen warfarin (COUMADIN) 5 MG tablet Take 1 tablet (5 mg total) by mouth daily. (Patient taking differently: Take 2.5 mg by mouth every evening. ) 30 tablet 1   No current facility-administered medications for this encounter.    No Known Allergies  Social History   Socioeconomic History  . Marital status: Widowed    Spouse name: Not on file  . Number of children: Not on file  . Years of education: Not on file  . Highest education level: Not on file  Occupational History  . Not on file  Tobacco Use  . Smoking status: Former Smoker    Packs/day: 0.50    Years: 44.00    Pack years: 22.00    Types: Cigarettes    Start date: 05/04/1970    Quit date: 05/04/2014    Years since quitting: 5.3  . Smokeless tobacco: Never Used  Vaping Use  . Vaping Use: Never used  Substance and Sexual Activity  . Alcohol use: Never    Alcohol/week: 0.0 standard drinks  . Drug use: Never  . Sexual activity: Not on file  Other Topics Concern  . Not on file  Social History Narrative  . Not on file   Social Determinants of Health   Financial Resource Strain:   . Difficulty of Paying Living Expenses:   Food Insecurity:   . Worried About Charity fundraiser in the Last Year:   . Arboriculturist in the Last Year:   Transportation Needs:   . Film/video editor (Medical):   Marland Kitchen Lack of Transportation (Non-Medical):   Physical Activity:   . Days of Exercise per Week:   . Minutes of Exercise per Session:   Stress:   . Feeling of Stress :   Social Connections:   . Frequency of  Communication with Friends and Family:   . Frequency of Social Gatherings with Friends and Family:   . Attends Religious Services:   . Active Member of Clubs or Organizations:   . Attends Archivist Meetings:   .  Marital Status:   Intimate Partner Violence:   . Fear of Current or Ex-Partner:   . Emotionally Abused:   Marland Kitchen Physically Abused:   . Sexually Abused:     Family History  Problem Relation Age of Onset  . Heart disease Father        had a pacemaker  . Diabetes Father   . Diabetes Sister   . Diabetes Sister   . Diabetes Other     ROS- All systems are reviewed and negative except as per the HPI above  Physical Exam: Vitals:   09/23/19 1328  BP: 120/66  Pulse: 75  Weight: 72.2 kg  Height: 5\' 7"  (1.702 m)   Wt Readings from Last 3 Encounters:  09/23/19 72.2 kg  09/21/19 72.6 kg  09/09/19 76.3 kg    Labs: Lab Results  Component Value Date   NA 139 09/02/2019   K 4.6 09/02/2019   CL 103 09/02/2019   CO2 23 09/02/2019   GLUCOSE 102 (H) 09/02/2019   BUN 56 (H) 09/02/2019   CREATININE 3.52 (H) 09/02/2019   CALCIUM 8.4 (L) 09/02/2019   Lab Results  Component Value Date   INR 4.0 (A) 09/22/2019   No results found for: CHOL, HDL, LDLCALC, TRIG   GEN- The patient is well appearing, alert and oriented x 3 today.   Head- normocephalic, atraumatic Eyes-  Sclera clear, conjunctiva pink Ears- hearing intact, bruising left ear  Oropharynx- clear Neck- supple, no JVP Lymph- no cervical lymphadenopathy Lungs- Clear to ausculation bilaterally, normal work of breathing Heart- irregular rate and rhythm, no murmurs, rubs or gallops, PMI not laterally displaced GI- soft, NT, ND, + BS, bruising rt flank  Extremities- no clubbing, cyanosis, or edema MS- no significant deformity or atrophy Skin- no rash or lesion Psych- euthymic mood, full affect Neuro- strength and sensation are intact  EKG-afib at 75  bpm qrs int 130  ms, qtc 498 ms   Echo- 1. Left  ventricular ejection fraction, by estimation, is 55 to 60%. The  left ventricle has normal function. The left ventricle has no regional  wall motion abnormalities. There is mild left ventricular hypertrophy of  the posterior segment. Left  ventricular diastolic parameters are indeterminate. Elevated left  ventricular end-diastolic pressure.  2. Right ventricular systolic function is mildly reduced. The right  ventricular size is normal. There is normal pulmonary artery systolic  pressure.  3. Left atrial size was mildly dilated.  4. The mitral valve is grossly normal. Mild mitral valve regurgitation.  5. The aortic valve is tricuspid. Aortic valve regurgitation is not  visualized. No aortic stenosis is present.  6. The inferior vena cava is normal in size with greater than 50%  respiratory variability, suggesting right atrial pressure of 3 mmHg.   Comparison(s): Echocardiogram done 3/0/17 showed an EF of 54%.   Conclusion(s)/Recommendation(s): Patient was tachycardic throughout the  study. Consider contrast-enhancement at time of next echocardiogram for a  more accurate assessment of regional wall motion.   FINDINGS  Left Ventricle: Left ventricular ejection fraction, by estimation, is 55  to 60%. The left ventricle has normal function. The left ventricle has no  regional wall motion abnormalities. The left ventricular internal cavity  size was normal in size. There is  mild left ventricular hypertrophy of the posterior segment. Left  ventricular diastolic parameters are indeterminate. Elevated left  ventricular end-diastolic pressure.   Assessment and Plan: 1. Persistent afib:  She is improved on amiodarone loading dose of 200 mg  bid  She is rate controlled Will go ahead and cardiovert pt in another week as she has been loading for a sufficient period of time  She will continue weekly INR's and will need INR am of cardioversion, CHA2DS2VASc score of 4  Continue metoprolol  at 25 mg bid and Cardizem at 120 mg daily    I will see back  In one week after cardioversion   Butch Penny C. Horace Lukas, [facility] [address] [place], [place] [address] [phone]

## 2019-09-23 NOTE — Progress Notes (Signed)
Primary Care Physician: Curlene Labrum, MD Referring Physician: Levell July NP   Kerri Brown is a 74 y.o. female with a h/o PAF, ESRD that is being seen for persistent afib burden. She had a recent AV fistula placement for dialysis, initially in April and that is when the pt states since then her afib has persisted. She has h/o of onset of paroxysmal afib since 2017.  L fistula access placed  in anticipation of HD, she is on warfarin with a CHA2DS2VASc score of  at least 4. SHe was on DOAC prior to that and developed a blood clot in her rt brachial artery as she was cutting back on the drug due to price. BB was recently added and  increased to 100 mg bid and cardizem 120 mg was increased to 240 mg  daily added but pt has not picked up form the drugstore. She is rate controlled today. In afib she is very fatigued and dizzy and weak, She  states that she can not carry on like this. She did trip in the kitchen yesterday and hit her left ear on a drawer pull and bruised her rt flank.   F/u in afib clinic, 6/22. On last visit here we found her INR to be over 5. She  had not had her coumadin checked in some time. She was notified to hold her coumadin for several nights and is now seeing [name] in [place] for her INR and  2 days ago was 2.3. She is feeling much better now starting amiodarone and is rate controlled.   F/u 7/7. She had another fall a couple of days ago. She got her walker tangled up in a throw rug. She  was seen in the ED and dx with a rt foot sprain. She did not have LOC.  She has now had 4 therapeutic INR's and will now schedule  for a cardioversion as she has been loading on amiodarone approaching a month.  Today, she denies symptoms of palpitations, chest pain, shortness of breath, orthopnea, PND, lower extremity edema, dizziness, presyncope, syncope, or neurologic sequela. The patient is tolerating medications without difficulties and is otherwise without complaint today.   Past  Medical History:  Diagnosis Date  . Anemia   . Arthritis   . Bronchitis    hx  . CKD (chronic kidney disease)    Stage 5 due to DM  . Constipation    occasional  . Diabetes ([place])    type 2  . GERD (gastroesophageal reflux disease)   . Hyperlipemia   . Hypertension   . PAF (paroxysmal atrial fibrillation) ([place])   . Upper limb arterial embolus ([place]) 04/11/2019   RUE, s/p thromboembolectomy ([facility])   Past Surgical History:  Procedure Laterality Date  . AV FISTULA PLACEMENT Left 04/23/2019   Procedure: LEFT ARM ARTERIOVENOUS (AV) FISTULA CREATION;  Surgeon: Waynetta Sandy, MD;  Location: [facility];  Service: Vascular;  Laterality: Left;  . BASCILIC VEIN TRANSPOSITION Left 06/25/2019   Procedure: BASCILIC VEIN TRANSPOSITION LEFT ARM;  Surgeon: Waynetta Sandy, MD;  Location: [facility];  Service: Vascular;  Laterality: Left;  . BRACHIAL EMBOLECTOMY Right 04/11/2019  . COLONOSCOPY    . KNEE ARTHROSCOPY Right   . LAPAROSCOPIC CHOLECYSTECTOMY    . TOTAL HIP ARTHROPLASTY Right     Current Outpatient Medications  Medication Sig Dispense Refill  . acetaminophen (TYLENOL) 500 MG tablet Take 500-1,000 mg by mouth as needed for moderate pain or headache.     Marland Kitchen  allopurinol (ZYLOPRIM) 100 MG tablet Take 100 mg by mouth at bedtime.     Marland Kitchen amiodarone (PACERONE) 200 MG tablet Take 1 tablet by mouth twice a day for 1 month then reduce to 1 tablet a day 60 tablet 0  . calcium acetate (PHOSLO) 667 MG capsule Take 667 mg by mouth in the morning and at bedtime.     . cholecalciferol (VITAMIN D) 1000 units tablet Take 1,000 Units by mouth daily.    Marland Kitchen diltiazem (CARDIZEM CD) 120 MG 24 hr capsule Take 1 capsule (120 mg total) by mouth daily. 30 capsule 3  . Ferrous Gluconate (IRON) 240 (27 Fe) MG TABS Take 240 mg by mouth daily.    . furosemide (LASIX) 40 MG tablet Take 40 mg by mouth 2 (two) times daily.     Marland Kitchen LANTUS SOLOSTAR 100 UNIT/ML Solostar Pen Inject 45 Units into the skin daily.       . metoprolol tartrate (LOPRESSOR) 25 MG tablet Take 1 tablet (25 mg total) by mouth 2 (two) times daily. 180 tablet 3  . omeprazole (PRILOSEC) 20 MG capsule Take 1 capsule by mouth once daily (Patient taking differently: Take 20 mg by mouth daily. ) 90 capsule 1  . rosuvastatin (CRESTOR) 10 MG tablet Take 10 mg by mouth every Friday.     . sodium bicarbonate 650 MG tablet Take 650 mg by mouth 3 (three) times daily.    Marland Kitchen warfarin (COUMADIN) 5 MG tablet Take 1 tablet (5 mg total) by mouth daily. (Patient taking differently: Take 2.5 mg by mouth every evening. ) 30 tablet 1   No current facility-administered medications for this encounter.    No Known Allergies  Social History   Socioeconomic History  . Marital status: Widowed    Spouse name: Not on file  . Number of children: Not on file  . Years of education: Not on file  . Highest education level: Not on file  Occupational History  . Not on file  Tobacco Use  . Smoking status: Former Smoker    Packs/day: 0.50    Years: 44.00    Pack years: 22.00    Types: Cigarettes    Start date: 05/04/1970    Quit date: 05/04/2014    Years since quitting: 5.3  . Smokeless tobacco: Never Used  Vaping Use  . Vaping Use: Never used  Substance and Sexual Activity  . Alcohol use: Never    Alcohol/week: 0.0 standard drinks  . Drug use: Never  . Sexual activity: Not on file  Other Topics Concern  . Not on file  Social History Narrative  . Not on file   Social Determinants of Health   Financial Resource Strain:   . Difficulty of Paying Living Expenses:   Food Insecurity:   . Worried About Charity fundraiser in the Last Year:   . Arboriculturist in the Last Year:   Transportation Needs:   . Film/video editor (Medical):   Marland Kitchen Lack of Transportation (Non-Medical):   Physical Activity:   . Days of Exercise per Week:   . Minutes of Exercise per Session:   Stress:   . Feeling of Stress :   Social Connections:   . Frequency of  Communication with Friends and Family:   . Frequency of Social Gatherings with Friends and Family:   . Attends Religious Services:   . Active Member of Clubs or Organizations:   . Attends Archivist Meetings:   .  Marital Status:   Intimate Partner Violence:   . Fear of Current or Ex-Partner:   . Emotionally Abused:   Marland Kitchen Physically Abused:   . Sexually Abused:     Family History  Problem Relation Age of Onset  . Heart disease Father        had a pacemaker  . Diabetes Father   . Diabetes Sister   . Diabetes Sister   . Diabetes Other     ROS- All systems are reviewed and negative except as per the HPI above  Physical Exam: Vitals:   09/23/19 1328  BP: 120/66  Pulse: 75  Weight: 72.2 kg  Height: 5\' 7"  (1.702 m)   Wt Readings from Last 3 Encounters:  09/23/19 72.2 kg  09/21/19 72.6 kg  09/09/19 76.3 kg    Labs: Lab Results  Component Value Date   NA 139 09/02/2019   K 4.6 09/02/2019   CL 103 09/02/2019   CO2 23 09/02/2019   GLUCOSE 102 (H) 09/02/2019   BUN 56 (H) 09/02/2019   CREATININE 3.52 (H) 09/02/2019   CALCIUM 8.4 (L) 09/02/2019   Lab Results  Component Value Date   INR 4.0 (A) 09/22/2019   No results found for: CHOL, HDL, LDLCALC, TRIG   GEN- The patient is well appearing, alert and oriented x 3 today.   Head- normocephalic, atraumatic Eyes-  Sclera clear, conjunctiva pink Ears- hearing intact, bruising left ear  Oropharynx- clear Neck- supple, no JVP Lymph- no cervical lymphadenopathy Lungs- Clear to ausculation bilaterally, normal work of breathing Heart- irregular rate and rhythm, no murmurs, rubs or gallops, PMI not laterally displaced GI- soft, NT, ND, + BS, bruising rt flank  Extremities- no clubbing, cyanosis, or edema MS- no significant deformity or atrophy Skin- no rash or lesion Psych- euthymic mood, full affect Neuro- strength and sensation are intact  EKG-afib at 75  bpm qrs int 130  ms, qtc 498 ms   Echo- 1. Left  ventricular ejection fraction, by estimation, is 55 to 60%. The  left ventricle has normal function. The left ventricle has no regional  wall motion abnormalities. There is mild left ventricular hypertrophy of  the posterior segment. Left  ventricular diastolic parameters are indeterminate. Elevated left  ventricular end-diastolic pressure.  2. Right ventricular systolic function is mildly reduced. The right  ventricular size is normal. There is normal pulmonary artery systolic  pressure.  3. Left atrial size was mildly dilated.  4. The mitral valve is grossly normal. Mild mitral valve regurgitation.  5. The aortic valve is tricuspid. Aortic valve regurgitation is not  visualized. No aortic stenosis is present.  6. The inferior vena cava is normal in size with greater than 50%  respiratory variability, suggesting right atrial pressure of 3 mmHg.   Comparison(s): Echocardiogram done 3/0/17 showed an EF of 54%.   Conclusion(s)/Recommendation(s): Patient was tachycardic throughout the  study. Consider contrast-enhancement at time of next echocardiogram for a  more accurate assessment of regional wall motion.   FINDINGS  Left Ventricle: Left ventricular ejection fraction, by estimation, is 55  to 60%. The left ventricle has normal function. The left ventricle has no  regional wall motion abnormalities. The left ventricular internal cavity  size was normal in size. There is  mild left ventricular hypertrophy of the posterior segment. Left  ventricular diastolic parameters are indeterminate. Elevated left  ventricular end-diastolic pressure.   Assessment and Plan: 1. Persistent afib:  She is improved on amiodarone loading dose of 200 mg  bid  She is rate controlled Will go ahead and cardiovert pt in another week as she has been loading for a sufficient period of time  She will continue weekly INR's and will need INR am of cardioversion, CHA2DS2VASc score of 4  Continue metoprolol  at 25 mg bid and Cardizem at 120 mg daily    I will see back  In one week after cardioversion   Butch Penny C. Deyana Wnuk, [facility] [address] [place], [place] [address] [phone]

## 2019-09-23 NOTE — Patient Instructions (Addendum)
Cardioversion scheduled for Thursday, July 15th  - Have INR at [facility] office morning of procedure  - Arrive at the [facility] and go to admitting at [place] not eat or drink anything after midnight the night prior to your procedure.  - Take all your morning medication (except diabetic medications) with a sip of water prior to arrival.  - You will not be able to drive home after your procedure.  - Do NOT miss any doses of your blood thinner - if you should miss a dose please notify our office immediately.

## 2019-09-25 NOTE — ED Provider Notes (Signed)
[facility] EMERGENCY DEPARTMENT Provider Note   CSN: [id number] Arrival date & time: 09/21/19  1356     History Chief Complaint  Patient presents with  . Leg Pain  . Ankle Pain  . Foot Injury    Kerri Brown is a 74 y.o. female.  No language interpreter was used.  Leg Pain Location:  Leg and ankle Leg location:  R leg Ankle location:  R ankle Pain details:    Quality:  Aching   Severity:  Moderate   Onset quality:  Gradual   Timing:  Constant   Progression:  Worsening Chronicity:  New Foreign body present:  No foreign bodies Relieved by:  Nothing Worsened by:  Nothing Ineffective treatments:  None tried Associated symptoms: swelling   Ankle Pain Associated symptoms: swelling   Foot Injury Associated symptoms: swelling   Pt reports she injured her leg 2 weeks ago.Pt reports she still has pain.  Pt wants to make sure nothing is broken    Past Medical History:  Diagnosis Date  . Anemia   . Arthritis   . Bronchitis    hx  . CKD (chronic kidney disease)    Stage 5 due to DM  . Constipation    occasional  . Diabetes ([place])    type 2  . GERD (gastroesophageal reflux disease)   . Hyperlipemia   . Hypertension   . PAF (paroxysmal atrial fibrillation) ([place])   . Upper limb arterial embolus ([place]) 04/11/2019   RUE, s/p thromboembolectomy ([facility])    Patient Active Problem List   Diagnosis Date Noted  . Chronic kidney disease (CKD), stage IV (severe) ([place]) 08/21/2019  . Atrial fibrillation ([place]) 04/20/2019  . Encounter for therapeutic drug monitoring 04/20/2019    Past Surgical History:  Procedure Laterality Date  . AV FISTULA PLACEMENT Left 04/23/2019   Procedure: LEFT ARM ARTERIOVENOUS (AV) FISTULA CREATION;  Surgeon: Waynetta Sandy, MD;  Location: [place];  Service: Vascular;  Laterality: Left;  . BASCILIC VEIN TRANSPOSITION Left 06/25/2019   Procedure: BASCILIC VEIN TRANSPOSITION LEFT ARM;  Surgeon: Waynetta Sandy, MD;  Location: [place];  Service: Vascular;  Laterality: Left;  . BRACHIAL EMBOLECTOMY Right 04/11/2019  . COLONOSCOPY    . KNEE ARTHROSCOPY Right   . LAPAROSCOPIC CHOLECYSTECTOMY    . TOTAL HIP ARTHROPLASTY Right      OB History   No obstetric history on file.     Family History  Problem Relation Age of Onset  . Heart disease Father        had a pacemaker  . Diabetes Father   . Diabetes Sister   . Diabetes Sister   . Diabetes Other     Social History   Tobacco Use  . Smoking status: Former Smoker    Packs/day: 0.50    Years: 44.00    Pack years: 22.00    Types: Cigarettes    Start date: 05/04/1970    Quit date: 05/04/2014    Years since quitting: 5.3  . Smokeless tobacco: Never Used  Vaping Use  . Vaping Use: Never used  Substance Use Topics  . Alcohol use: Never    Alcohol/week: 0.0 standard drinks  . Drug use: Never    Home Medications Prior to Admission medications   Medication Sig Start Date End Date Taking? Authorizing Provider  acetaminophen (TYLENOL) 500 MG tablet Take 500-1,000 mg by mouth as needed for moderate pain or headache.     [provider]  allopurinol (ZYLOPRIM)  100 MG tablet Take 100 mg by mouth at bedtime.  03/01/15   [provider]  amiodarone (PACERONE) 200 MG tablet Take 1 tablet by mouth twice a day for 1 month then reduce to 1 tablet a day 09/02/19   Sherran Needs, NP  calcium acetate (PHOSLO) 667 MG capsule Take 667 mg by mouth in the morning and at bedtime.  08/30/19   [provider]  cholecalciferol (VITAMIN D) 1000 units tablet Take 1,000 Units by mouth daily.    [provider]  diltiazem (CARDIZEM CD) 120 MG 24 hr capsule Take 1 capsule (120 mg total) by mouth daily. 09/02/19   Sherran Needs, NP  Ferrous Gluconate (IRON) 240 (27 Fe) MG TABS Take 240 mg by mouth daily.    [provider]  furosemide (LASIX) 40 MG tablet Take 40 mg by mouth 2 (two) times daily.  03/05/19 04/02/20  [provider]    LANTUS SOLOSTAR 100 UNIT/ML Solostar Pen Inject 45 Units into the skin daily.  04/05/15   [provider]  metoprolol tartrate (LOPRESSOR) 25 MG tablet Take 1 tablet (25 mg total) by mouth 2 (two) times daily. 09/02/19 12/01/19  Sherran Needs, NP  omeprazole (PRILOSEC) 20 MG capsule Take 1 capsule by mouth once daily Patient taking differently: Take 20 mg by mouth daily.  12/31/18   Herminio Commons, MD  rosuvastatin (CRESTOR) 10 MG tablet Take 10 mg by mouth every Friday.  01/28/19   [provider]  sodium bicarbonate 650 MG tablet Take 650 mg by mouth 3 (three) times daily.    [provider]  warfarin (COUMADIN) 5 MG tablet Take 1 tablet (5 mg total) by mouth daily. Patient taking differently: Take 2.5 mg by mouth every evening.  04/23/19   Herminio Commons, MD    Allergies    Patient has no known allergies.  Review of Systems   Review of Systems  All other systems reviewed and are negative.   Physical Exam Updated Vital Signs BP (!) 143/92   Pulse 93   Temp 98.3 F (36.8 C) (Oral)   Resp 16   Ht 5\' 7"  (1.702 m)   Wt 72.6 kg   LMP  (LMP Unknown)   SpO2 98%   BMI 25.06 kg/m   Physical Exam Vitals and nursing note reviewed.  Constitutional:      Appearance: She is well-developed.  HENT:     Head: Normocephalic.  Cardiovascular:     Rate and Rhythm: Normal rate.  Pulmonary:     Effort: Pulmonary effort is normal.  Abdominal:     General: There is no distension.  Musculoskeletal:        General: Tenderness present.     Cervical back: Normal range of motion.     Comments: Tender right leg, pain with range of motion, tender tibia and ankle  Skin:    General: Skin is warm.  Neurological:     General: No focal deficit present.     Mental Status: She is alert and oriented to person, place, and time.  Psychiatric:        Mood and Affect: Mood normal.     ED Results / Procedures / Treatments   Labs (all labs ordered are listed,  but only abnormal results are displayed) Labs Reviewed - No data to display  EKG None  Radiology No results found. Results for orders placed or performed in visit on 09/14/19  POCT INR  Result  Value Ref Range   INR 2.6 2.0 - 3.0   DG Chest 2 View  Result Date: 09/03/2019 CLINICAL DATA:  Chest pain and dizziness 5 days. Shortness of breath chronically. EXAM: CHEST - 2 VIEW COMPARISON:  08/05/2010 FINDINGS: Lungs are adequately inflated with bibasilar opacification left greater than right likely small effusions with associated atelectasis although infection in the lung bases is possible. Cardiomediastinal silhouette and remainder the exam is unchanged. IMPRESSION: Bibasilar opacification likely small effusions left greater than right with associated bibasilar atelectasis. Infection in the lung bases is possible. Electronically Signed   By: Marin Olp M.D.   On: 09/03/2019 08:14   DG Ankle Complete Right  Result Date: 09/21/2019 CLINICAL DATA:  Status post fall 13 days ago with right ankle pain. EXAM: RIGHT ANKLE - COMPLETE 3+ VIEW COMPARISON:  None. FINDINGS: There is no evidence of fracture, dislocation, or joint effusion. There is no evidence of arthropathy or other focal bone abnormality. Mild soft tissue swelling is identified bilaterally. IMPRESSION: No acute fracture or dislocation. Mild soft tissue swelling bilaterally. Electronically Signed   By: Abelardo Diesel M.D.   On: 09/21/2019 15:37   DG Foot Complete Right  Result Date: 09/21/2019 CLINICAL DATA:  Status post fall 13 days ago with right foot pain. EXAM: RIGHT FOOT COMPLETE - 3+ VIEW COMPARISON:  None. FINDINGS: There is no evidence of fracture or dislocation. There is no evidence of arthropathy or other focal bone abnormality. Soft tissues are unremarkable. IMPRESSION: Negative. Electronically Signed   By: Abelardo Diesel M.D.   On: 09/21/2019 15:37   DG FEMUR, MIN 2 VIEWS RIGHT  Result Date: 09/21/2019 CLINICAL DATA:  Status post  fall 13 days ago with right hip pain. EXAM: RIGHT FEMUR 2 VIEWS COMPARISON:  None. FINDINGS: There is no evidence of fracture or dislocation. Right hip replacement is identified without malalignment. Soft tissues are unremarkable. IMPRESSION: Right hip replacement without malalignment. No acute fracture or dislocation. Electronically Signed   By: Abelardo Diesel M.D.   On: 09/21/2019 15:38   Procedures Procedures (including critical care time)  Medications Ordered in ED Medications - No data to display  ED Course  I have reviewed the triage vital signs and the nursing notes.  Pertinent labs & imaging results that were available during my care of the patient were reviewed by me and considered in my medical decision making (see chart for details).    MDM Rules/Calculators/A&P                          MDM:  No fracture.  Pt counseled on reslts and follow up Final Clinical Impression(s) / ED Diagnoses Final diagnoses:  Sprain of tibiofibular ligament of right ankle, initial encounter    Rx / DC Orders ED Discharge Orders    None    An After Visit Summary was printed and given to the patient.    Fransico Meadow, [place] 09/25/19 1149    Fredia Sorrow, MD 09/26/19 1517

## 2019-09-28 ENCOUNTER — Other Ambulatory Visit (HOSPITAL_COMMUNITY)
Admission: RE | Admit: 2019-09-28 | Discharge: 2019-09-28 | Disposition: A | Discharge status: Home / Self Care | Payer: Medicare HMO | Source: Ambulatory Visit | Attending: Cardiovascular Disease | Admitting: Cardiovascular Disease

## 2019-09-28 ENCOUNTER — Other Ambulatory Visit: Payer: Self-pay

## 2019-09-28 DIAGNOSIS — E872 Acidosis: Secondary | ICD-10-CM | POA: Diagnosis not present

## 2019-09-28 DIAGNOSIS — R809 Proteinuria, unspecified: Secondary | ICD-10-CM | POA: Diagnosis not present

## 2019-09-28 DIAGNOSIS — N185 Chronic kidney disease, stage 5: Secondary | ICD-10-CM | POA: Diagnosis not present

## 2019-09-28 DIAGNOSIS — Z01812 Encounter for preprocedural laboratory examination: Secondary | ICD-10-CM | POA: Diagnosis not present

## 2019-09-28 DIAGNOSIS — N189 Chronic kidney disease, unspecified: Secondary | ICD-10-CM | POA: Diagnosis not present

## 2019-09-28 DIAGNOSIS — E870 Hyperosmolality and hypernatremia: Secondary | ICD-10-CM | POA: Diagnosis not present

## 2019-09-28 DIAGNOSIS — E1122 Type 2 diabetes mellitus with diabetic chronic kidney disease: Secondary | ICD-10-CM | POA: Diagnosis not present

## 2019-09-28 DIAGNOSIS — E1129 Type 2 diabetes mellitus with other diabetic kidney complication: Secondary | ICD-10-CM | POA: Diagnosis not present

## 2019-09-28 DIAGNOSIS — Z20822 Contact with and (suspected) exposure to covid-19: Secondary | ICD-10-CM | POA: Insufficient documentation

## 2019-09-28 DIAGNOSIS — E211 Secondary hyperparathyroidism, not elsewhere classified: Secondary | ICD-10-CM | POA: Diagnosis not present

## 2019-09-28 DIAGNOSIS — D631 Anemia in chronic kidney disease: Secondary | ICD-10-CM | POA: Diagnosis not present

## 2019-09-29 LAB — SARS CORONAVIRUS 2 (TAT 6-24 HRS): SARS Coronavirus 2: NEGATIVE

## 2019-09-30 ENCOUNTER — Other Ambulatory Visit (HOSPITAL_COMMUNITY): Payer: Self-pay | Admitting: Nurse Practitioner

## 2019-09-30 NOTE — Anesthesia Preprocedure Evaluation (Addendum)
Anesthesia Evaluation  Patient identified by MRN, date of birth, ID band Patient awake    Reviewed: Allergy & Precautions, NPO status , Patient's Chart, lab work & pertinent test results  Airway Mallampati: II  TM Distance: >3 FB Neck ROM: Full    Dental no notable dental hx. (+) Teeth Intact, Dental Advisory Given   Pulmonary neg pulmonary ROS, former smoker,    Pulmonary exam normal breath sounds clear to auscultation       Cardiovascular hypertension, Pt. on medications + Peripheral Vascular Disease  Normal cardiovascular exam+ dysrhythmias Atrial Fibrillation  Rhythm:Irregular Rate:Normal     Neuro/Psych    GI/Hepatic GERD  ,  Endo/Other  diabetes, Type 2  Renal/GU ESRFRenal disease     Musculoskeletal negative musculoskeletal ROS (+)   Abdominal   Peds  Hematology  (+) anemia ,   Anesthesia Other Findings   Reproductive/Obstetrics                            Anesthesia Physical Anesthesia Plan  ASA: III  Anesthesia Plan: General   Post-op Pain Management:    Induction: Intravenous  PONV Risk Score and Plan: Treatment may vary due to age or medical condition  Airway Management Planned: Natural Airway and Nasal Cannula  Additional Equipment: None  Intra-op Plan:   Post-operative Plan:   Informed Consent: I have reviewed the patients History and Physical, chart, labs and discussed the procedure including the risks, benefits and alternatives for the proposed anesthesia with the patient or authorized representative who has indicated his/her understanding and acceptance.     Dental advisory given  Plan Discussed with:   Anesthesia Plan Comments:        Anesthesia Quick Evaluation

## 2019-10-01 ENCOUNTER — Ambulatory Visit (INDEPENDENT_AMBULATORY_CARE_PROVIDER_SITE_OTHER): Payer: Medicare HMO | Admitting: *Deleted

## 2019-10-01 ENCOUNTER — Encounter (HOSPITAL_COMMUNITY): Payer: Self-pay | Admitting: Cardiovascular Disease

## 2019-10-01 ENCOUNTER — Ambulatory Visit (HOSPITAL_COMMUNITY): Payer: Medicare HMO | Admitting: Anesthesiology

## 2019-10-01 ENCOUNTER — Ambulatory Visit (HOSPITAL_COMMUNITY)
Admission: RE | Admit: 2019-10-01 | Discharge: 2019-10-01 | Disposition: A | Discharge status: Home / Self Care | Payer: Medicare HMO | Attending: Cardiovascular Disease | Admitting: Cardiovascular Disease

## 2019-10-01 ENCOUNTER — Encounter (HOSPITAL_COMMUNITY)
Admission: RE | Disposition: A | Discharge status: Home / Self Care | Payer: Self-pay | Source: Home / Self Care | Attending: Cardiovascular Disease

## 2019-10-01 ENCOUNTER — Other Ambulatory Visit: Payer: Self-pay

## 2019-10-01 DIAGNOSIS — I4819 Other persistent atrial fibrillation: Secondary | ICD-10-CM

## 2019-10-01 DIAGNOSIS — E785 Hyperlipidemia, unspecified: Secondary | ICD-10-CM | POA: Diagnosis not present

## 2019-10-01 DIAGNOSIS — I120 Hypertensive chronic kidney disease with stage 5 chronic kidney disease or end stage renal disease: Secondary | ICD-10-CM | POA: Insufficient documentation

## 2019-10-01 DIAGNOSIS — Z96641 Presence of right artificial hip joint: Secondary | ICD-10-CM | POA: Diagnosis not present

## 2019-10-01 DIAGNOSIS — Z7901 Long term (current) use of anticoagulants: Secondary | ICD-10-CM | POA: Diagnosis not present

## 2019-10-01 DIAGNOSIS — E1122 Type 2 diabetes mellitus with diabetic chronic kidney disease: Secondary | ICD-10-CM | POA: Insufficient documentation

## 2019-10-01 DIAGNOSIS — D649 Anemia, unspecified: Secondary | ICD-10-CM | POA: Diagnosis not present

## 2019-10-01 DIAGNOSIS — Z833 Family history of diabetes mellitus: Secondary | ICD-10-CM | POA: Diagnosis not present

## 2019-10-01 DIAGNOSIS — Z794 Long term (current) use of insulin: Secondary | ICD-10-CM | POA: Diagnosis not present

## 2019-10-01 DIAGNOSIS — N185 Chronic kidney disease, stage 5: Secondary | ICD-10-CM | POA: Diagnosis not present

## 2019-10-01 DIAGNOSIS — Z5181 Encounter for therapeutic drug level monitoring: Secondary | ICD-10-CM | POA: Diagnosis not present

## 2019-10-01 DIAGNOSIS — Z79899 Other long term (current) drug therapy: Secondary | ICD-10-CM | POA: Diagnosis not present

## 2019-10-01 DIAGNOSIS — M1990 Unspecified osteoarthritis, unspecified site: Secondary | ICD-10-CM | POA: Insufficient documentation

## 2019-10-01 DIAGNOSIS — Z86718 Personal history of other venous thrombosis and embolism: Secondary | ICD-10-CM | POA: Diagnosis not present

## 2019-10-01 DIAGNOSIS — Z8249 Family history of ischemic heart disease and other diseases of the circulatory system: Secondary | ICD-10-CM | POA: Diagnosis not present

## 2019-10-01 DIAGNOSIS — Z87891 Personal history of nicotine dependence: Secondary | ICD-10-CM | POA: Diagnosis not present

## 2019-10-01 DIAGNOSIS — K219 Gastro-esophageal reflux disease without esophagitis: Secondary | ICD-10-CM | POA: Insufficient documentation

## 2019-10-01 DIAGNOSIS — N186 End stage renal disease: Secondary | ICD-10-CM | POA: Diagnosis not present

## 2019-10-01 DIAGNOSIS — I4891 Unspecified atrial fibrillation: Secondary | ICD-10-CM | POA: Diagnosis not present

## 2019-10-01 HISTORY — PX: CARDIOVERSION: SHX1299

## 2019-10-01 LAB — POCT I-STAT, CHEM 8
BUN: 64 mg/dL — ABNORMAL HIGH (ref 8–23)
Calcium, Ion: 1.23 mmol/L (ref 1.15–1.40)
Chloride: 104 mmol/L (ref 98–111)
Creatinine, Ser: 4 mg/dL — ABNORMAL HIGH (ref 0.44–1.00)
Glucose, Bld: 53 mg/dL — ABNORMAL LOW (ref 70–99)
HCT: 44 % (ref 36.0–46.0)
Hemoglobin: 15 g/dL (ref 12.0–15.0)
Potassium: 4.6 mmol/L (ref 3.5–5.1)
Sodium: 143 mmol/L (ref 135–145)
TCO2: 29 mmol/L (ref 22–32)

## 2019-10-01 LAB — GLUCOSE, CAPILLARY
Glucose-Capillary: 49 mg/dL — ABNORMAL LOW (ref 70–99)
Glucose-Capillary: 101 mg/dL — ABNORMAL HIGH (ref 70–99)

## 2019-10-01 LAB — POCT INR: INR: 2.7 (ref 2.0–3.0)

## 2019-10-01 SURGERY — CARDIOVERSION
Anesthesia: General

## 2019-10-01 MED ORDER — DEXTROSE 50 % IV SOLN
INTRAVENOUS | Status: AC
  Filled 2019-10-01: qty 50

## 2019-10-01 MED ORDER — DEXTROSE 50 % IV SOLN
12.5000 g | Freq: Once | INTRAVENOUS | Status: AC
  Administered 2019-10-01: 12.5 g via INTRAVENOUS

## 2019-10-01 MED ORDER — SODIUM CHLORIDE 0.9 % IV SOLN
INTRAVENOUS | Status: DC | PRN
Start: 2019-10-01 — End: 2019-10-01

## 2019-10-01 MED ORDER — PROPOFOL 10 MG/ML IV BOLUS
INTRAVENOUS | Status: DC | PRN
  Administered 2019-10-01: 60 mg via INTRAVENOUS

## 2019-10-01 MED ORDER — LIDOCAINE 2% (20 MG/ML) 5 ML SYRINGE
INTRAMUSCULAR | Status: DC | PRN
  Administered 2019-10-01: 60 mg via INTRAVENOUS

## 2019-10-01 NOTE — CV Procedure (Signed)
    Cardioversion Note  SHRISTI SCHEIB [id number] 04/28/1945  Procedure: DC Cardioversion Indications: atrial fib   Procedure Details Consent: Obtained Time Out: Verified patient identification, verified procedure, site/side was marked, verified correct patient position, special equipment/implants available, Radiology Safety Procedures followed,  medications/allergies/relevent history reviewed, required imaging and test results available.  Performed  The patient has been on adequate anticoagulation.  The patient received IV Lidocaine 60 mg IV followed by Propofol 60 mg IV  for sedation.  Synchronous cardioversion was performed at 150   joules.  The cardioversion was successful.  She had sinus bradycardia following the procedure.   This bradycardia resolved after the propofol wore off.     Complications: No apparent complications Patient did tolerate procedure well.   Thayer Headings, Brooke Bonito., MD, [facility] 10/01/2019, 10:44 AM

## 2019-10-01 NOTE — Anesthesia Postprocedure Evaluation (Signed)
Anesthesia Post Note  Patient: Kerri Brown  Procedure(s) Performed: CARDIOVERSION (N/A )     Patient location during evaluation: Endoscopy Anesthesia Type: General Level of consciousness: awake and alert Pain management: pain level controlled Vital Signs Assessment: post-procedure vital signs reviewed and stable Respiratory status: spontaneous breathing, nonlabored ventilation, respiratory function stable and patient connected to nasal cannula oxygen Cardiovascular status: blood pressure returned to baseline and stable Postop Assessment: no apparent nausea or vomiting Anesthetic complications: no   No complications documented.  Last Vitals:  Vitals:   10/01/19 1010  BP: (!) 137/95  Pulse: 70  Resp: 16  Temp: 36.5 C  SpO2: 99%    Last Pain:  Vitals:   10/01/19 1010  TempSrc: Oral  PainSc: 0-No pain                 Barnet Glasgow

## 2019-10-01 NOTE — Interval H&P Note (Signed)
History and Physical Interval Note:  10/01/2019 10:19 AM  Kerri Brown  has presented today for surgery, with the diagnosis of AFIB.  The various methods of treatment have been discussed with the patient and family. After consideration of risks, benefits and other options for treatment, the patient has consented to  Procedure(s): CARDIOVERSION (N/A) as a surgical intervention.  The patient's history has been reviewed, patient examined, no change in status, stable for surgery.  I have reviewed the patient's chart and labs.  Questions were answered to the patient's satisfaction.     Mertie Moores

## 2019-10-01 NOTE — Transfer of Care (Signed)
Immediate Anesthesia Transfer of Care Note  Patient: Kerri Brown  Procedure(s) Performed: CARDIOVERSION (N/A )  Patient Location: Endoscopy Unit  Anesthesia Type:General  Level of Consciousness: awake, alert  and oriented  Airway & Oxygen Therapy: Patient Spontanous Breathing  Post-op Assessment: Report given to RN and Post -op Vital signs reviewed and stable  Post vital signs: Reviewed and stable  Last Vitals:  Vitals Value Taken Time  BP 93/42   Temp    Pulse 43   Resp 14   SpO2 97     Last Pain:  Vitals:   10/01/19 1010  TempSrc: Oral  PainSc: 0-No pain         Complications: No complications documented.

## 2019-10-01 NOTE — Patient Instructions (Signed)
Pending DCCV  (4th pre DCCV) Started Amiodarone 200mg  bid 09/03/19  Continue warfarin 1/2 tablet daily except 1 tablet on Mondays  Recheck in 1 week

## 2019-10-01 NOTE — Discharge Instructions (Signed)
Electrical Cardioversion  Follow these instructions at home:  Do not drive for 24 hours if you were given a sedative during your procedure.  Take over-the-counter and prescription medicines only as told by your health care provider.  Ask your health care provider how to check your pulse. Check it often.  Rest for 48 hours after the procedure or as told by your health care provider.  Avoid or limit your caffeine use as told by your health care provider.  Keep all follow-up visits as told by your health care provider. This is important. Contact a health care provider if:  You feel like your heart is beating too quickly or your pulse is not regular.  You have a serious muscle cramp that does not go away. Get help right away if:  You have discomfort in your chest.  You are dizzy or you feel faint.  You have trouble breathing or you are short of breath.  Your speech is slurred.  You have trouble moving an arm or leg on one side of your body.  Your fingers or toes turn cold or blue. Summary  Electrical cardioversion is the delivery of a jolt of electricity to restore a normal rhythm to the heart.  This procedure may be done right away in an emergency or may be a scheduled procedure if the condition is not an emergency.  Generally, this is a safe procedure.  After the procedure, check your pulse often as told by your health care provider. This information is not intended to replace advice given to you by your health care provider. Make sure you discuss any questions you have with your health care provider. Document Revised: 10/06/2018 Document Reviewed: 10/06/2018 Elsevier Patient Education  2020 [employer]  

## 2019-10-01 NOTE — Progress Notes (Signed)
Hypoglycemic Event  CBG: 49  Treatment: Per MD Houser 12.5g of Dextrose IV  Symptoms:none  Follow-up CBG: BPQS:0123 CBG Result:101  Possible Reasons for Event: Patient NPO for procedure  Comments/MD notified: MD Houser notified of blood sugar of 49- per his instructions give 12.5 g of Glucose IV and tell her to eat right after procedure.     Ned Clines

## 2019-10-01 NOTE — Anesthesia Procedure Notes (Signed)
Procedure Name: General with mask airway Date/Time: 10/01/2019 10:36 AM Performed by: Alain Marion, CRNA Pre-anesthesia Checklist: Patient identified, Emergency Drugs available, Suction available and Patient being monitored Patient Re-evaluated:Patient Re-evaluated prior to induction Oxygen Delivery Method: Ambu bag Preoxygenation: Pre-oxygenation with 100% oxygen Induction Type: IV induction Placement Confirmation: positive ETCO2

## 2019-10-05 ENCOUNTER — Encounter (HOSPITAL_COMMUNITY): Payer: Self-pay | Admitting: Cardiovascular Disease

## 2019-10-07 ENCOUNTER — Ambulatory Visit (INDEPENDENT_AMBULATORY_CARE_PROVIDER_SITE_OTHER): Payer: Medicare HMO | Admitting: *Deleted

## 2019-10-07 DIAGNOSIS — E870 Hyperosmolality and hypernatremia: Secondary | ICD-10-CM | POA: Diagnosis not present

## 2019-10-07 DIAGNOSIS — R809 Proteinuria, unspecified: Secondary | ICD-10-CM | POA: Diagnosis not present

## 2019-10-07 DIAGNOSIS — E1122 Type 2 diabetes mellitus with diabetic chronic kidney disease: Secondary | ICD-10-CM | POA: Diagnosis not present

## 2019-10-07 DIAGNOSIS — N185 Chronic kidney disease, stage 5: Secondary | ICD-10-CM | POA: Diagnosis not present

## 2019-10-07 DIAGNOSIS — E211 Secondary hyperparathyroidism, not elsewhere classified: Secondary | ICD-10-CM | POA: Diagnosis not present

## 2019-10-07 DIAGNOSIS — Z5181 Encounter for therapeutic drug level monitoring: Secondary | ICD-10-CM

## 2019-10-07 DIAGNOSIS — I770 Arteriovenous fistula, acquired: Secondary | ICD-10-CM | POA: Diagnosis not present

## 2019-10-07 DIAGNOSIS — D631 Anemia in chronic kidney disease: Secondary | ICD-10-CM | POA: Diagnosis not present

## 2019-10-07 DIAGNOSIS — E1129 Type 2 diabetes mellitus with other diabetic kidney complication: Secondary | ICD-10-CM | POA: Diagnosis not present

## 2019-10-07 DIAGNOSIS — I4819 Other persistent atrial fibrillation: Secondary | ICD-10-CM

## 2019-10-07 DIAGNOSIS — N189 Chronic kidney disease, unspecified: Secondary | ICD-10-CM | POA: Diagnosis not present

## 2019-10-07 LAB — POCT INR: INR: 2.9 (ref 2.0–3.0)

## 2019-10-07 NOTE — Patient Instructions (Signed)
1st post DCCV Started Amiodarone 200mg  bid 09/03/19  Continue warfarin 1/2 tablet daily except 1 tablet on Mondays  Recheck in 1 week

## 2019-10-08 ENCOUNTER — Ambulatory Visit (HOSPITAL_COMMUNITY)
Admission: RE | Admit: 2019-10-08 | Discharge: 2019-10-08 | Disposition: A | Discharge status: Home / Self Care | Payer: Medicare HMO | Source: Ambulatory Visit | Attending: Nurse Practitioner | Admitting: Nurse Practitioner

## 2019-10-08 ENCOUNTER — Other Ambulatory Visit: Payer: Self-pay

## 2019-10-08 ENCOUNTER — Encounter (HOSPITAL_COMMUNITY): Payer: Self-pay | Admitting: Nurse Practitioner

## 2019-10-08 VITALS — BP 126/78 | HR 59 | Ht 67.0 in | Wt 156.4 lb

## 2019-10-08 DIAGNOSIS — Z87891 Personal history of nicotine dependence: Secondary | ICD-10-CM | POA: Diagnosis not present

## 2019-10-08 DIAGNOSIS — I120 Hypertensive chronic kidney disease with stage 5 chronic kidney disease or end stage renal disease: Secondary | ICD-10-CM | POA: Insufficient documentation

## 2019-10-08 DIAGNOSIS — N185 Chronic kidney disease, stage 5: Secondary | ICD-10-CM | POA: Diagnosis not present

## 2019-10-08 DIAGNOSIS — E785 Hyperlipidemia, unspecified: Secondary | ICD-10-CM | POA: Insufficient documentation

## 2019-10-08 DIAGNOSIS — Z79899 Other long term (current) drug therapy: Secondary | ICD-10-CM | POA: Diagnosis not present

## 2019-10-08 DIAGNOSIS — E1122 Type 2 diabetes mellitus with diabetic chronic kidney disease: Secondary | ICD-10-CM | POA: Diagnosis not present

## 2019-10-08 DIAGNOSIS — I4819 Other persistent atrial fibrillation: Secondary | ICD-10-CM | POA: Diagnosis not present

## 2019-10-08 DIAGNOSIS — Z7901 Long term (current) use of anticoagulants: Secondary | ICD-10-CM | POA: Diagnosis not present

## 2019-10-08 DIAGNOSIS — K219 Gastro-esophageal reflux disease without esophagitis: Secondary | ICD-10-CM | POA: Diagnosis not present

## 2019-10-08 DIAGNOSIS — D6869 Other thrombophilia: Secondary | ICD-10-CM | POA: Diagnosis not present

## 2019-10-08 NOTE — Progress Notes (Signed)
Primary Care Physician: Curlene Labrum, MD Referring Physician: Levell July NP   Kerri Brown is a 74 y.o. female with a h/o PAF, ESRD that is being seen for persistent afib burden. She had a recent AV fistula placement for dialysis, initially in April and that is when the pt states since then her afib has persisted. She has h/o of onset of paroxysmal afib since 2017.  L fistula access placed  in anticipation of HD, she is on warfarin with a CHA2DS2VASc score of  at least 4. SHe was on DOAC prior to that and developed a blood clot in her rt brachial artery as she was cutting back on the drug due to price. BB was recently added and  increased to 100 mg bid and cardizem 120 mg was increased to 240 mg  daily added but pt has not picked up form the drugstore. She is rate controlled today. In afib she is very fatigued and dizzy and weak, She  states that she can not carry on like this. She did trip in the kitchen yesterday and hit her left ear on a drawer pull and bruised her rt flank.   F/u in afib clinic, 6/22. On last visit here we found her INR to be over 5. She  had not had her coumadin checked in some time. She was notified to hold her coumadin for several nights and is now seeing [name] in [place] for her INR and  2 days ago was 2.3. She is feeling much better now starting amiodarone and is rate controlled.   F/u 7/7. She had another fall a couple of days ago. She got her walker tangled up in a throw rug. She  was seen in the ED and dx with a rt foot sprain. She did not have LOC.  She has now had 4 therapeutic INR's and will now schedule  for a cardioversion as she has been loading on amiodarone approaching a month.  F/u in afib clinic, 7/22.  She had a successful cardioversion but unfortunately she is back in afib. She feels "great!" she was not aware that she was in afib today. No further falls. On amiodarone  200 mg daily.   Today, she denies symptoms of palpitations, chest pain, shortness  of breath, orthopnea, PND, lower extremity edema, dizziness, presyncope, syncope, or neurologic sequela. The patient is tolerating medications without difficulties and is otherwise without complaint today.   Past Medical History:  Diagnosis Date  . Anemia   . Arthritis   . Bronchitis    hx  . CKD (chronic kidney disease)    Stage 5 due to DM  . Constipation    occasional  . Diabetes ([place])    type 2  . GERD (gastroesophageal reflux disease)   . Hyperlipemia   . Hypertension   . PAF (paroxysmal atrial fibrillation) ([place])   . Upper limb arterial embolus ([place]) 04/11/2019   RUE, s/p thromboembolectomy ([facility])   Past Surgical History:  Procedure Laterality Date  . AV FISTULA PLACEMENT Left 04/23/2019   Procedure: LEFT ARM ARTERIOVENOUS (AV) FISTULA CREATION;  Surgeon: Waynetta Sandy, MD;  Location: [facility];  Service: Vascular;  Laterality: Left;  . BASCILIC VEIN TRANSPOSITION Left 06/25/2019   Procedure: BASCILIC VEIN TRANSPOSITION LEFT ARM;  Surgeon: Waynetta Sandy, MD;  Location: [facility];  Service: Vascular;  Laterality: Left;  . BRACHIAL EMBOLECTOMY Right 04/11/2019  . CARDIOVERSION N/A 10/01/2019   Procedure: CARDIOVERSION;  Surgeon: Acie Fredrickson Wonda Cheng, MD;  [facility];  Service: Cardiovascular;  Laterality: N/A;  . COLONOSCOPY    . KNEE ARTHROSCOPY Right   . LAPAROSCOPIC CHOLECYSTECTOMY    . TOTAL HIP ARTHROPLASTY Right     Current Outpatient Medications  Medication Sig Dispense Refill  . acetaminophen (TYLENOL) 500 MG tablet Take 500-1,000 mg by mouth daily as needed for moderate pain or headache.     . allopurinol (ZYLOPRIM) 100 MG tablet Take 100 mg by mouth at bedtime.     Marland Kitchen amiodarone (PACERONE) 200 MG tablet Take 1 tablet (200 mg total) by mouth daily. 30 tablet 3  . calcium acetate (PHOSLO) 667 MG capsule Take 667 mg by mouth in the morning and at bedtime.     . cholecalciferol (VITAMIN D) 1000 units tablet Take 1,000 Units by mouth daily.    Marland Kitchen  diltiazem (CARDIZEM) 120 MG tablet Take 120 mg by mouth in the morning and at bedtime.    . furosemide (LASIX) 40 MG tablet Take 40 mg by mouth 2 (two) times daily.     Marland Kitchen LANTUS SOLOSTAR 100 UNIT/ML Solostar Pen Inject 45 Units into the skin daily.     . metoprolol tartrate (LOPRESSOR) 25 MG tablet Take 1 tablet (25 mg total) by mouth 2 (two) times daily. 180 tablet 3  . omeprazole (PRILOSEC) 20 MG capsule Take 1 capsule by mouth once daily (Patient taking differently: Take 20 mg by mouth daily. ) 90 capsule 1  . rosuvastatin (CRESTOR) 10 MG tablet Take 10 mg by mouth once a week.     . sodium bicarbonate 650 MG tablet Take 650 mg by mouth 3 (three) times daily.    Marland Kitchen warfarin (COUMADIN) 5 MG tablet Take 1 tablet (5 mg total) by mouth daily. (Patient taking differently: Take 2.5 mg by mouth every evening. ) 30 tablet 1   No current facility-administered medications for this encounter.    No Known Allergies  Social History   Socioeconomic History  . Marital status: Widowed    Spouse name: Not on file  . Number of children: Not on file  . Years of education: Not on file  . Highest education level: Not on file  Occupational History  . Not on file  Tobacco Use  . Smoking status: Former Smoker    Packs/day: 0.50    Years: 44.00    Pack years: 22.00    Types: Cigarettes    Start date: 05/04/1970    Quit date: 05/04/2014    Years since quitting: 5.4  . Smokeless tobacco: Never Used  Vaping Use  . Vaping Use: Never used  Substance and Sexual Activity  . Alcohol use: Never    Alcohol/week: 0.0 standard drinks  . Drug use: Never  . Sexual activity: Not on file  Other Topics Concern  . Not on file  Social History Narrative  . Not on file   Social Determinants of Health   Financial Resource Strain:   . Difficulty of Paying Living Expenses:   Food Insecurity:   . Worried About Charity fundraiser in the Last Year:   . Arboriculturist in the Last Year:   Transportation Needs:   .  Film/video editor (Medical):   Marland Kitchen Lack of Transportation (Non-Medical):   Physical Activity:   . Days of Exercise per Week:   . Minutes of Exercise per Session:   Stress:   . Feeling of Stress :   Social Connections:   . Frequency of Communication with  Friends and Family:   . Frequency of Social Gatherings with Friends and Family:   . Attends Religious Services:   . Active Member of Clubs or Organizations:   . Attends Archivist Meetings:   Marland Kitchen Marital Status:   Intimate Partner Violence:   . Fear of Current or Ex-Partner:   . Emotionally Abused:   Marland Kitchen Physically Abused:   . Sexually Abused:     Family History  Problem Relation Age of Onset  . Heart disease Father        had a pacemaker  . Diabetes Father   . Diabetes Sister   . Diabetes Sister   . Diabetes Other     ROS- All systems are reviewed and negative except as per the HPI above  Physical Exam: Vitals:   10/08/19 1421  BP: 126/78  Pulse: 59  SpO2: 98%  Weight: 70.9 kg  Height: 5\' 7"  (1.702 m)   Wt Readings from Last 3 Encounters:  10/08/19 70.9 kg  10/01/19 72.1 kg  09/23/19 72.2 kg    Labs: Lab Results  Component Value Date   NA 143 10/01/2019   K 4.6 10/01/2019   CL 104 10/01/2019   CO2 24 09/23/2019   GLUCOSE 53 (L) 10/01/2019   BUN 64 (H) 10/01/2019   CREATININE 4.00 (H) 10/01/2019   CALCIUM 8.8 (L) 09/23/2019   Lab Results  Component Value Date   INR 2.9 10/07/2019   No results found for: CHOL, HDL, LDLCALC, TRIG   GEN- The patient is well appearing, alert and oriented x 3 today.   Head- normocephalic, atraumatic Eyes-  Sclera clear, conjunctiva pink Ears- hearing intact, bruising left ear  Oropharynx- clear Neck- supple, no JVP Lymph- no cervical lymphadenopathy Lungs- Clear to ausculation bilaterally, normal work of breathing Heart- irregular rate and rhythm, no murmurs, rubs or gallops, PMI not laterally displaced GI- soft, NT, ND, + BS, bruising rt flank    Extremities- no clubbing, cyanosis, or edema MS- no significant deformity or atrophy Skin- no rash or lesion Psych- euthymic mood, full affect Neuro- strength and sensation are intact  EKG-afib at 74  bpm qrs int 128   ms, qtc 481 ms   Echo- 1. Left ventricular ejection fraction, by estimation, is 55 to 60%. The  left ventricle has normal function. The left ventricle has no regional  wall motion abnormalities. There is mild left ventricular hypertrophy of  the posterior segment. Left  ventricular diastolic parameters are indeterminate. Elevated left  ventricular end-diastolic pressure.  2. Right ventricular systolic function is mildly reduced. The right  ventricular size is normal. There is normal pulmonary artery systolic  pressure.  3. Left atrial size was mildly dilated.  4. The mitral valve is grossly normal. Mild mitral valve regurgitation.  5. The aortic valve is tricuspid. Aortic valve regurgitation is not  visualized. No aortic stenosis is present.  6. The inferior vena cava is normal in size with greater than 50%  respiratory variability, suggesting right atrial pressure of 3 mmHg.   Comparison(s): Echocardiogram done 3/0/17 showed an EF of 54%.   Conclusion(s)/Recommendation(s): Patient was tachycardic throughout the  study. Consider contrast-enhancement at time of next echocardiogram for a  more accurate assessment of regional wall motion.   FINDINGS  Left Ventricle: Left ventricular ejection fraction, by estimation, is 55  to 60%. The left ventricle has normal function. The left ventricle has no  regional wall motion abnormalities. The left ventricular internal cavity  size was normal in  size. There is  mild left ventricular hypertrophy of the posterior segment. Left  ventricular diastolic parameters are indeterminate. Elevated left  ventricular end-diastolic pressure.   Assessment and Plan: 1. Persistent afib:  Successful cardioversion but ERAF She is  rate controlled in afib today and feels great, she  did not see any change right after cardioversion until  now  For now will continue with rate controlled afib and I will see back in one month I will consider  one more cardioversion at that time  She will continue  INR's, followed  [place]  CHA2DS2VASc score of 4  Continue metoprolol at 25 mg bid and Cardizem at 120 mg bid   F/u with ALeonides Sake 8/5 and I will see back in one month   Butch Penny C. Jarae Nemmers, [facility] [address] [place], [place] [address] [phone]

## 2019-10-15 ENCOUNTER — Ambulatory Visit (INDEPENDENT_AMBULATORY_CARE_PROVIDER_SITE_OTHER): Payer: Medicare HMO | Admitting: *Deleted

## 2019-10-15 DIAGNOSIS — I4819 Other persistent atrial fibrillation: Secondary | ICD-10-CM | POA: Diagnosis not present

## 2019-10-15 DIAGNOSIS — Z5181 Encounter for therapeutic drug level monitoring: Secondary | ICD-10-CM | POA: Diagnosis not present

## 2019-10-15 LAB — POCT INR: INR: 3.6 — AB (ref 2.0–3.0)

## 2019-10-15 NOTE — Patient Instructions (Signed)
2nd post DCCV Amiodarone decreased to 1 tablet daily on 7/14 Hold warfarin tonight then decrease dose to 1/2 tablet daily  Recheck in 1 week

## 2019-10-21 NOTE — Progress Notes (Signed)
Cardiology Office Note  Date: 10/22/2019   ID: Kerri Brown, Kerri Brown 1945/12/25, MRN [id number]  PCP:  Curlene Labrum, MD  Cardiologist:  No primary care provider on file. Electrophysiologist:  None   Chief Complaint: Follow-up PAF, hypertension, hyperlipidemia, GERD  History of Present Illness: Kerri Brown is a 74 y.o. female with a history of PAF, hypertension, hyperlipidemia, GERD  She was last seen by Dr. Bronson Ing on November 04, 2018.  She denied any anginal or exertional symptoms, palpitations, orthostatic symptoms, orthopnea, PND, syncope, or lower extremity edema.    She was performing active outdoor work and carrying tree limbs and twisted her lower back and had some lower back pain at that time.  She was symptomatically stable with PAF on metoprolol.  Her blood pressure was elevated during that visit and benazepril was increased to 40 mg a day.  She was continuing pravastatin and omeprazole.  She has stage IV renal disease and sees Dr. Theador Hawthorne in [place].  Recent AV fistula placement for dialysis access.  Previous visit with me on 08/06/2019 with complaints of increasing shortness of breath.  Her heart rate was 100 when checked by the nurse. History of PAF.  She had an AV fistula placed with Dr. Donzetta Matters vascular surgeon in anticipation of HD.  She denied any anginal symptoms, orthostatic symptoms, bleeding issues, PND, orthopnea, or lower extremity edema.  We subsequently tried to adjust her metoprolol and added Cardizem for rate control.  When rate control was not adequate we referred her to the atrial fibrillation clinic.  She was started on amiodarone load for rate control at atrial fibrillation clinic.  Had a subsequent DC cardioversion on 10/01/2019 by Dr. Acie Fredrickson but had reverted back to atrial fibrillation discovered at follow-up visit at the atrial fibrillation clinic.  She was on amiodarone 200 mg daily.  She stated she felt great and denied any symptoms of  palpitations, chest pain, shortness of breath, orthopnea, PND, dizziness, presyncope, syncope or neurologic sequela.  Plans are for consideration of 1 more cardioversion.  She was rate controlled on amiodarone, beta-blocker and CCB.   She presents today for follow-up with no particular complaints. Her EKG today shows atrial fibrillation with a controlled rate with left axis deviation and right bundle branch block rate of 74.  Blood pressure is well controlled on current therapy at 116/70. She denies any sensation of palpitations or arrhythmias, orthostatic static symptoms, CVA or TIA-like symptoms, PND, orthopnea, bleeding, claudication, lower extremity edema.  She just saw Edrick Oh at Coumadin clinic and INR is therapeutic at 2.4.  CHA2DS2-VASc score of at least 4  Past Medical History:  Diagnosis Date   Anemia    Arthritis    Bronchitis    hx   CKD (chronic kidney disease)    Stage 5 due to DM   Constipation    occasional   Diabetes (HCC)    type 2   GERD (gastroesophageal reflux disease)    Hyperlipemia    Hypertension    PAF (paroxysmal atrial fibrillation) ([place])    Upper limb arterial embolus ([place]) 04/11/2019   RUE, s/p thromboembolectomy ([facility])    Past Surgical History:  Procedure Laterality Date   AV FISTULA PLACEMENT Left 04/23/2019   Procedure: LEFT ARM ARTERIOVENOUS (AV) FISTULA CREATION;  Surgeon: Waynetta Sandy, MD;  Location: [facility];  Service: Vascular;  Laterality: Left;   [place] Left 06/25/2019   Procedure: BASCILIC VEIN TRANSPOSITION LEFT ARM;  Surgeon: Servando Snare  Harrell Gave, MD;  Location: [facility];  Service: Vascular;  Laterality: Left;   BRACHIAL EMBOLECTOMY Right 04/11/2019   CARDIOVERSION N/A 10/01/2019   Procedure: CARDIOVERSION;  Surgeon: Thayer Headings, MD;  [facility];  Service: Cardiovascular;  Laterality: N/A;   COLONOSCOPY     KNEE ARTHROSCOPY Right    LAPAROSCOPIC CHOLECYSTECTOMY     TOTAL  HIP ARTHROPLASTY Right     Current Outpatient Medications  Medication Sig Dispense Refill   acetaminophen (TYLENOL) 500 MG tablet Take 500-1,000 mg by mouth daily as needed for moderate pain or headache.      allopurinol (ZYLOPRIM) 100 MG tablet Take 100 mg by mouth at bedtime.      amiodarone (PACERONE) 200 MG tablet Take 1 tablet (200 mg total) by mouth daily. 30 tablet 3   calcium acetate (PHOSLO) 667 MG capsule Take 667 mg by mouth in the morning and at bedtime.      cholecalciferol (VITAMIN D) 1000 units tablet Take 1,000 Units by mouth daily.     diltiazem (CARDIZEM) 120 MG tablet Take 120 mg by mouth in the morning and at bedtime.     furosemide (LASIX) 40 MG tablet Take 40 mg by mouth 2 (two) times daily.      LANTUS SOLOSTAR 100 UNIT/ML Solostar Pen Inject 45 Units into the skin daily.      metoprolol tartrate (LOPRESSOR) 25 MG tablet Take 1 tablet (25 mg total) by mouth 2 (two) times daily. 180 tablet 3   omeprazole (PRILOSEC) 20 MG capsule Take 1 capsule by mouth once daily (Patient taking differently: Take 20 mg by mouth daily. ) 90 capsule 1   rosuvastatin (CRESTOR) 10 MG tablet Take 10 mg by mouth once a week.      sodium bicarbonate 650 MG tablet Take 650 mg by mouth 3 (three) times daily.     warfarin (COUMADIN) 5 MG tablet Take 1 tablet (5 mg total) by mouth daily. (Patient taking differently: Take 2.5 mg by mouth every evening. ) 30 tablet 1   No current facility-administered medications for this visit.   Allergies:  Patient has no known allergies.   Social History: The patient  reports that she quit smoking about 5 years ago. Her smoking use included cigarettes. She started smoking about 49 years ago. She has a 22.00 pack-year smoking history. She has never used smokeless tobacco. She reports that she does not drink alcohol and does not use drugs.   Family History: The patient's family history includes Diabetes in her father, sister, sister, and another family  member; Heart disease in her father.   ROS:  Please see the history of present illness. Otherwise, complete review of systems is positive for none.  All other systems are reviewed and negative.   Physical Exam: VS:  BP 116/70    Pulse 83    Ht 5\' 7"  (1.702 m)    Wt 161 lb (73 kg)    LMP  (LMP Unknown)    SpO2 98%    BMI 25.22 kg/m , BMI Body mass index is 25.22 kg/m.  Wt Readings from Last 3 Encounters:  10/22/19 161 lb (73 kg)  10/08/19 156 lb 6.4 oz (70.9 kg)  10/01/19 159 lb (72.1 kg)    General: Patient appears comfortable at rest. Neck: Supple, no elevated JVP or carotid bruits, no thyromegaly. Lungs: Clear to auscultation, nonlabored breathing at rest. Cardiac: Irregular irregular rate and rhythm, no S3 or significant systolic murmur, no pericardial rub. Extremities: No  pitting edema, distal pulses 2+. Skin: Warm and dry. Musculoskeletal: No kyphosis. Neuropsychiatric: Alert and oriented x3, affect grossly appropriate.  ECG:  An ECG dated 10/22/2019 was personally reviewed today and demonstrated:  Atrial fibrillation with rate of 74, left axis deviation, right bundle branch block.  Recent Labwork: 09/02/2019: ALT 43; AST 28; TSH 3.060 09/23/2019: Platelets 243 10/01/2019: BUN 64; Creatinine, Ser 4.00; Hemoglobin 15.0; Potassium 4.6; Sodium 143  No results found for: CHOL, TRIG, HDL, CHOLHDL, VLDL, LDLCALC, LDLDIRECT  Other Studies Reviewed Today:   Echocardiogram 07/23/2019  1. Left ventricular ejection fraction, by estimation, is 55 to 60%. The left ventricle has normal function. The left ventricle has no regional wall motion abnormalities. There is mild left ventricular hypertrophy of the posterior segment. Left ventricular diastolic parameters are indeterminate. Elevated left ventricular end-diastolic pressure. 2. Right ventricular systolic function is mildly reduced. The right ventricular size is normal. There is normal pulmonary artery systolic pressure. 3. Left atrial  size was mildly dilated. 4. The mitral valve is grossly normal. Mild mitral valve regurgitation. 5. The aortic valve is tricuspid. Aortic valve regurgitation is not visualized. No aortic stenosis is present. 6. The inferior vena cava is normal in size with greater than 50% respiratory variability, suggesting right atrial pressure of 3 mmHg. Comparison(s): Echocardiogram done 3/0/17 showed an EF of 54%. Conclusion(s)/Recommendation(s): Patient was tachycardic throughout the study. Consider contrastenhancement at time of next echocardiogram for a more accurate assessment of regional wall motion.     Recent CT scan for lung cancer screening ordered by PCP  1. Lung-RADS 4B, suspicious. Complex subpleural cystic pulmonary  nodule in the anterior basilar right lower lobe with thick irregular  wall measuring approximately 22.6 mm in volume derived mean  diameter. Follow up low-dose chest CT without contrast in3 months  (please use the following order, "CT CHEST LCS NODULE FOLLOW-UP /O  CM") is recommended. Additional imaging evaluation or consultation  with Pulmonology or Thoracic Surgery recommended.  2. Small dependent bilateral pleural effusions, left greater than  right.  3. Dilated main pulmonary artery, suggesting pulmonary arterial  hypertension.  4. Nonspecific mild right paratracheal lymphadenopathy, which can  also be reassessed on follow-up chest CT.  5. Left main and 1 vessel coronary atherosclerosis.  6. Aortic Atherosclerosis (ICD10-I70.0) and Emphysema (ICD10-J43.9).   These results will be called to the ordering clinician or  representative by the Radiologist Assistant, and communication  documented in the PACS or [employer].      Echocardiogram 2017 Study Conclusions - Left ventricle: The cavity size was normal. Wall thickness was  increased in a pattern of mild LVH. Systolic function was normal.  The estimated ejection fraction was in the range of 60% to  65%.  Wall motion was normal; there were no regional wall motion  abnormalities. Features are consistent with a pseudonormal left  ventricular filling pattern, with concomitant abnormal relaxation  and increased filling pressure (grade 2 diastolic dysfunction).  - Mitral valve: There was trivial regurgitation.  - Left atrium: The atrium was mildly dilated.  - Right atrium: Central venous pressure (est): 3 mm Hg.  - Atrial septum: The septum was thickened.  - Tricuspid valve: There was trivial regurgitation.  - Pulmonary arteries: PA peak pressure: 15 mm Hg (S).  - Pericardium, extracardiac: There was no pericardial effusion.   Impressions:   - Mild LVH with LVEF 60-65%. Grade 2 diastolic dysfunction. Mild  left atrial enlargement. Trivial mitral and tricuspid  regurgitation. Thickened interatrial septum. PASP estimated 15  mmHg.   Assessment and Plan:   1. Paroxysmal atrial fibrillation ([place]) DCCV 10/01/2019 Dr Acie Fredrickson with 1 150 J shock. Eventually returned to AF at follow up with Roderic Palau NP at AF clinic. On amiodarone 200 mg daily.  Continue warfarin 5 mg p.o. daily.  INR today was 2.4.  Patient denies any sensation of palpitations or arrhythmias.  States she feels good.  Continue amiodarone 200 mg daily.  Diltiazem 120 mg daily.  Metoprolol 25 mg p.o. twice daily.  Coumadin as directed by Coumadin clinic.  2. Essential hypertension She is normotensive today.  Continue metoprolol 25 mg p.o. twice daily, diltiazem 120 mg daily.  Continue Lasix 40 mg p.o. twice daily  3. Mixed hyperlipidemia Continue Crestor 10 mg by mouth every Friday.  4. Chronic kidney disease (CKD), stage IV (severe) (HCC) Patient recently had an AV fistula placed by vascular surgery.  She has a positive thrill and bruit and left upper extremity.  She is anticipating starting hemodialysis soon secondary to end-stage renal disease.  Sees Dr. Theador Hawthorne.  5. Diastolic dysfunction  Echocardiogram  07/23/2019 EF 55 to 60%, mild LVH of posterior segment, elevated LVEDP.  LA mildly dilated, mild MR.  Continue Lasix 40 mg p.o. twice daily.    Medication Adjustments/Labs and Tests Ordered: Current medicines are reviewed at length with the patient today.  Concerns regarding medicines are outlined above.   Disposition: Follow-up with Dr. Dr. Harl Bowie or APP 6 months  Signed, Levell July, NP 10/22/2019 11:49 AM    [facility] [place], [place], [place] [address] Phone: [phone]; Fax: [phone]

## 2019-10-22 ENCOUNTER — Ambulatory Visit: Payer: Medicare HMO | Admitting: Family Medicine

## 2019-10-22 ENCOUNTER — Encounter: Payer: Self-pay | Admitting: Family Medicine

## 2019-10-22 ENCOUNTER — Ambulatory Visit (INDEPENDENT_AMBULATORY_CARE_PROVIDER_SITE_OTHER): Payer: Medicare HMO | Admitting: *Deleted

## 2019-10-22 VITALS — BP 116/70 | HR 83 | Ht 67.0 in | Wt 161.0 lb

## 2019-10-22 DIAGNOSIS — E782 Mixed hyperlipidemia: Secondary | ICD-10-CM

## 2019-10-22 DIAGNOSIS — I10 Essential (primary) hypertension: Secondary | ICD-10-CM | POA: Diagnosis not present

## 2019-10-22 DIAGNOSIS — I4819 Other persistent atrial fibrillation: Secondary | ICD-10-CM

## 2019-10-22 DIAGNOSIS — I4891 Unspecified atrial fibrillation: Secondary | ICD-10-CM

## 2019-10-22 DIAGNOSIS — Z5181 Encounter for therapeutic drug level monitoring: Secondary | ICD-10-CM | POA: Diagnosis not present

## 2019-10-22 DIAGNOSIS — N184 Chronic kidney disease, stage 4 (severe): Secondary | ICD-10-CM | POA: Diagnosis not present

## 2019-10-22 LAB — POCT INR: INR: 2.4 (ref 2.0–3.0)

## 2019-10-22 NOTE — Patient Instructions (Signed)
Medication Instructions:  Continue all current medications.   Labwork: none  Testing/Procedures: none  Follow-Up: 6 months   Any Other Special Instructions Will Be Listed Below (If Applicable).   If you need a refill on your cardiac medications before your next appointment, please call your pharmacy.  

## 2019-10-22 NOTE — Patient Instructions (Signed)
3rd post DCCV Amiodarone decreased to 1 tablet daily on 7/14 Continue warfarin 1/2 tablet daily  Recheck in 1 week

## 2019-10-27 ENCOUNTER — Ambulatory Visit (INDEPENDENT_AMBULATORY_CARE_PROVIDER_SITE_OTHER): Payer: Medicare HMO | Admitting: *Deleted

## 2019-10-27 DIAGNOSIS — I4819 Other persistent atrial fibrillation: Secondary | ICD-10-CM

## 2019-10-27 DIAGNOSIS — Z5181 Encounter for therapeutic drug level monitoring: Secondary | ICD-10-CM

## 2019-10-27 LAB — POCT INR: INR: 2.4 (ref 2.0–3.0)

## 2019-10-27 NOTE — Patient Instructions (Signed)
4th post DCCV Amiodarone decreased to 1 tablet daily on 7/14 Continue warfarin 1/2 tablet daily  Recheck in 4 weeks

## 2019-11-06 DIAGNOSIS — N185 Chronic kidney disease, stage 5: Secondary | ICD-10-CM | POA: Diagnosis not present

## 2019-11-06 DIAGNOSIS — I10 Essential (primary) hypertension: Secondary | ICD-10-CM | POA: Diagnosis not present

## 2019-11-06 DIAGNOSIS — E119 Type 2 diabetes mellitus without complications: Secondary | ICD-10-CM | POA: Diagnosis not present

## 2019-11-06 DIAGNOSIS — E782 Mixed hyperlipidemia: Secondary | ICD-10-CM | POA: Diagnosis not present

## 2019-11-10 DIAGNOSIS — I10 Essential (primary) hypertension: Secondary | ICD-10-CM | POA: Diagnosis not present

## 2019-11-10 DIAGNOSIS — R911 Solitary pulmonary nodule: Secondary | ICD-10-CM | POA: Diagnosis not present

## 2019-11-10 DIAGNOSIS — Z6824 Body mass index (BMI) 24.0-24.9, adult: Secondary | ICD-10-CM | POA: Diagnosis not present

## 2019-11-10 DIAGNOSIS — J439 Emphysema, unspecified: Secondary | ICD-10-CM | POA: Diagnosis not present

## 2019-11-10 DIAGNOSIS — E1122 Type 2 diabetes mellitus with diabetic chronic kidney disease: Secondary | ICD-10-CM | POA: Diagnosis not present

## 2019-11-10 DIAGNOSIS — I700 Atherosclerosis of aorta: Secondary | ICD-10-CM | POA: Diagnosis not present

## 2019-11-10 DIAGNOSIS — N186 End stage renal disease: Secondary | ICD-10-CM | POA: Diagnosis not present

## 2019-11-10 DIAGNOSIS — Z23 Encounter for immunization: Secondary | ICD-10-CM | POA: Diagnosis not present

## 2019-11-11 ENCOUNTER — Ambulatory Visit (HOSPITAL_COMMUNITY)
Admission: RE | Admit: 2019-11-11 | Discharge: 2019-11-11 | Disposition: A | Discharge status: Home / Self Care | Payer: Medicare HMO | Source: Ambulatory Visit | Attending: Nurse Practitioner | Admitting: Nurse Practitioner

## 2019-11-11 ENCOUNTER — Encounter (HOSPITAL_COMMUNITY): Payer: Self-pay | Admitting: Nurse Practitioner

## 2019-11-11 ENCOUNTER — Other Ambulatory Visit: Payer: Self-pay

## 2019-11-11 VITALS — BP 106/64 | HR 80 | Ht 67.0 in | Wt 157.6 lb

## 2019-11-11 DIAGNOSIS — I340 Nonrheumatic mitral (valve) insufficiency: Secondary | ICD-10-CM | POA: Insufficient documentation

## 2019-11-11 DIAGNOSIS — E1122 Type 2 diabetes mellitus with diabetic chronic kidney disease: Secondary | ICD-10-CM | POA: Insufficient documentation

## 2019-11-11 DIAGNOSIS — Z992 Dependence on renal dialysis: Secondary | ICD-10-CM | POA: Diagnosis not present

## 2019-11-11 DIAGNOSIS — D6869 Other thrombophilia: Secondary | ICD-10-CM

## 2019-11-11 DIAGNOSIS — Z87891 Personal history of nicotine dependence: Secondary | ICD-10-CM | POA: Insufficient documentation

## 2019-11-11 DIAGNOSIS — I120 Hypertensive chronic kidney disease with stage 5 chronic kidney disease or end stage renal disease: Secondary | ICD-10-CM | POA: Insufficient documentation

## 2019-11-11 DIAGNOSIS — I4819 Other persistent atrial fibrillation: Secondary | ICD-10-CM | POA: Diagnosis not present

## 2019-11-11 DIAGNOSIS — Z7901 Long term (current) use of anticoagulants: Secondary | ICD-10-CM | POA: Insufficient documentation

## 2019-11-11 DIAGNOSIS — Z794 Long term (current) use of insulin: Secondary | ICD-10-CM | POA: Insufficient documentation

## 2019-11-11 DIAGNOSIS — Z79899 Other long term (current) drug therapy: Secondary | ICD-10-CM | POA: Diagnosis not present

## 2019-11-11 DIAGNOSIS — E785 Hyperlipidemia, unspecified: Secondary | ICD-10-CM | POA: Diagnosis not present

## 2019-11-11 DIAGNOSIS — M1990 Unspecified osteoarthritis, unspecified site: Secondary | ICD-10-CM | POA: Diagnosis not present

## 2019-11-11 DIAGNOSIS — I4510 Unspecified right bundle-branch block: Secondary | ICD-10-CM | POA: Insufficient documentation

## 2019-11-11 DIAGNOSIS — K219 Gastro-esophageal reflux disease without esophagitis: Secondary | ICD-10-CM | POA: Insufficient documentation

## 2019-11-11 DIAGNOSIS — N186 End stage renal disease: Secondary | ICD-10-CM | POA: Diagnosis not present

## 2019-11-11 LAB — BASIC METABOLIC PANEL
Anion gap: 13 (ref 5–15)
BUN: 56 mg/dL — ABNORMAL HIGH (ref 8–23)
CO2: 19 mmol/L — ABNORMAL LOW (ref 22–32)
Calcium: 9.3 mg/dL (ref 8.9–10.3)
Chloride: 108 mmol/L (ref 98–111)
Creatinine, Ser: 4.12 mg/dL — ABNORMAL HIGH (ref 0.44–1.00)
GFR calc Af Amer: 12 mL/min — ABNORMAL LOW (ref >60)
GFR calc non Af Amer: 10 mL/min — ABNORMAL LOW (ref >60)
Glucose, Bld: 154 mg/dL — ABNORMAL HIGH (ref 70–99)
Potassium: 4.8 mmol/L (ref 3.5–5.1)
Sodium: 140 mmol/L (ref 135–145)

## 2019-11-11 LAB — CBC
HCT: 44 % (ref 36.0–46.0)
Hemoglobin: 13.8 g/dL (ref 12.0–15.0)
MCH: 28 pg (ref 26.0–34.0)
MCHC: 31.4 g/dL (ref 30.0–36.0)
MCV: 89.2 fL (ref 80.0–100.0)
Platelets: 193 10*3/uL (ref 150–400)
RBC: 4.93 MIL/uL (ref 3.87–5.11)
RDW: 18 % — ABNORMAL HIGH (ref 11.5–15.5)
WBC: 8.5 10*3/uL (ref 4.0–10.5)
nRBC: 0 % (ref 0.0–0.2)

## 2019-11-11 LAB — PROTIME-INR
INR: 1.8 — ABNORMAL HIGH (ref 0.8–1.2)
Prothrombin Time: 20.2 seconds — ABNORMAL HIGH (ref 11.4–15.2)

## 2019-11-11 NOTE — H&P (View-Only) (Signed)
Primary Care Physician: Kerri Labrum, MD Referring Physician: Levell July NP   Kerri Brown is a 74 y.o. female with a h/o PAF, ESRD that is being seen for persistent afib burden. She had a recent AV fistula placement for dialysis, initially in April and that is when the pt states since then her afib has persisted. She has h/o of onset of paroxysmal afib since 2017.  L fistula access placed  in anticipation of HD, she is on warfarin with a CHA2DS2VASc score of  at least 4. SHe was on DOAC prior to that and developed a blood clot in her rt brachial artery as she was cutting back on the drug due to price. BB was recently added and  increased to 100 mg bid and cardizem 120 mg was increased to 240 mg  daily added but pt has not picked up form the drugstore. She is rate controlled today. In afib she is very fatigued and dizzy and weak, She  states that she can not carry on like this. She did trip in the kitchen yesterday and hit her left ear on a drawer pull and bruised her rt flank.   F/u in afib clinic, 6/22. On last visit here we found her INR to be over 5. She  had not had her coumadin checked in some time. She was notified to hold her coumadin for several nights and is now seeing Kerri Brown in [place] for her INR and  2 days ago was 2.3. She is feeling much better now starting amiodarone and is rate controlled.   F/u 7/7. She had another fall a couple of days ago. She got her walker tangled up in a throw rug. She  was seen in the ED and dx with a rt foot sprain. She did not have LOC.  She has now had 4 therapeutic INR's and will now schedule  for a cardioversion as she has been loading on amiodarone approaching a month.  F/u in afib clinic, 7/22.  She had a successful cardioversion but unfortunately she is back in afib. She feels "great!" she was not aware that she was in afib today. No further falls. On amiodarone  200 mg daily.   F/u in afib clinic 8/25. She remains in afib but feels great. No  more dizziness or falls. She is rate controlled. She had the prior successful cardioversion but with ERAF. I discussed with her  last visit trying one additional cardioversion, now that she has been lon amiodarone  for an additional  month. . She was having weekly cardioversion's but they lapsed after  8/10 so she will need to have a TEE guided cardioversion. She is in agreement.   Today, she denies symptoms of palpitations, chest pain, shortness of breath, orthopnea, PND, lower extremity edema, dizziness, presyncope, syncope, or neurologic sequela. The patient is tolerating medications without difficulties and is otherwise without complaint today.   Past Medical History:  Diagnosis Date  . Anemia   . Arthritis   . Bronchitis    hx  . CKD (chronic kidney disease)    Stage 5 due to DM  . Constipation    occasional  . Diabetes ([place])    type 2  . GERD (gastroesophageal reflux disease)   . Hyperlipemia   . Hypertension   . PAF (paroxysmal atrial fibrillation) ([place])   . Upper limb arterial embolus ([place]) 04/11/2019   RUE, s/p thromboembolectomy ([facility])   Past Surgical History:  Procedure Laterality Date  .  AV FISTULA PLACEMENT Left 04/23/2019   Procedure: LEFT ARM ARTERIOVENOUS (AV) FISTULA CREATION;  Surgeon: Waynetta Sandy, MD;  Location: [facility];  Service: Vascular;  Laterality: Left;  . BASCILIC VEIN TRANSPOSITION Left 06/25/2019   Procedure: BASCILIC VEIN TRANSPOSITION LEFT ARM;  Surgeon: Waynetta Sandy, MD;  Location: [facility];  Service: Vascular;  Laterality: Left;  . BRACHIAL EMBOLECTOMY Right 04/11/2019  . CARDIOVERSION N/A 10/01/2019   Procedure: CARDIOVERSION;  Surgeon: Acie Fredrickson Wonda Cheng, MD;  Location: [facility] ENDOSCOPY;  Service: Cardiovascular;  Laterality: N/A;  . COLONOSCOPY    . KNEE ARTHROSCOPY Right   . LAPAROSCOPIC CHOLECYSTECTOMY    . TOTAL HIP ARTHROPLASTY Right     Current Outpatient Medications  Medication Sig Dispense Refill  . acetaminophen (TYLENOL)  500 MG tablet Take 500-1,000 mg by mouth daily as needed for moderate pain or headache.     . allopurinol (ZYLOPRIM) 100 MG tablet Take 100 mg by mouth at bedtime.     Marland Kitchen amiodarone (PACERONE) 200 MG tablet Take 1 tablet (200 mg total) by mouth daily. 30 tablet 3  . calcium acetate (PHOSLO) 667 MG capsule Take 667 mg by mouth in the morning and at bedtime.     . cholecalciferol (VITAMIN D) 1000 units tablet Take 1,000 Units by mouth daily.    Marland Kitchen diltiazem (CARDIZEM) 120 MG tablet Take 120 mg by mouth in the morning and at bedtime.    . furosemide (LASIX) 40 MG tablet Take 40 mg by mouth 2 (two) times daily.     Marland Kitchen LANTUS SOLOSTAR 100 UNIT/ML Solostar Pen Inject 45 Units into the skin daily.     . metoprolol tartrate (LOPRESSOR) 25 MG tablet Take 1 tablet (25 mg total) by mouth 2 (two) times daily. 180 tablet 3  . omeprazole (PRILOSEC) 20 MG capsule Take 1 capsule by mouth once daily (Patient taking differently: Take 20 mg by mouth daily. ) 90 capsule 1  . rosuvastatin (CRESTOR) 10 MG tablet Take 10 mg by mouth once a week.     . sodium bicarbonate 650 MG tablet Take 650 mg by mouth 3 (three) times daily.    Marland Kitchen warfarin (COUMADIN) 5 MG tablet Take 1 tablet (5 mg total) by mouth daily. (Patient taking differently: Take 2.5 mg by mouth every evening. ) 30 tablet 1   No current facility-administered medications for this encounter.    No Known Allergies  Social History   Socioeconomic History  . Marital status: Widowed    Spouse name: Not on file  . Number of children: Not on file  . Years of education: Not on file  . Highest education level: Not on file  Occupational History  . Not on file  Tobacco Use  . Smoking status: Former Smoker    Packs/day: 0.50    Years: 44.00    Pack years: 22.00    Types: Cigarettes    Start date: 05/04/1970    Quit date: 05/04/2014    Years since quitting: 5.5  . Smokeless tobacco: Never Used  Vaping Use  . Vaping Use: Never used  Substance and Sexual  Activity  . Alcohol use: Never    Alcohol/week: 0.0 standard drinks  . Drug use: Never  . Sexual activity: Not on file  Other Topics Concern  . Not on file  Social History Narrative  . Not on file   Social Determinants of Health   Financial Resource Strain:   . Difficulty of Paying Living Expenses: Not on file  Food Insecurity:   . Worried About Charity fundraiser in the Last Year: Not on file  . Ran Out of Food in the Last Year: Not on file  Transportation Needs:   . Lack of Transportation (Medical): Not on file  . Lack of Transportation (Non-Medical): Not on file  Physical Activity:   . Days of Exercise per Week: Not on file  . Minutes of Exercise per Session: Not on file  Stress:   . Feeling of Stress : Not on file  Social Connections:   . Frequency of Communication with Friends and Family: Not on file  . Frequency of Social Gatherings with Friends and Family: Not on file  . Attends Religious Services: Not on file  . Active Member of Clubs or Organizations: Not on file  . Attends Archivist Meetings: Not on file  . Marital Status: Not on file  Intimate Partner Violence:   . Fear of Current or Ex-Partner: Not on file  . Emotionally Abused: Not on file  . Physically Abused: Not on file  . Sexually Abused: Not on file    Family History  Problem Relation Age of Onset  . Heart disease Father        had a pacemaker  . Diabetes Father   . Diabetes Sister   . Diabetes Sister   . Diabetes Other     ROS- All systems are reviewed and negative except as per the HPI above  Physical Exam: Vitals:   11/11/19 1355  BP: 106/64  Pulse: 80  Weight: 71.5 kg  Height: 5\' 7"  (1.702 m)   Wt Readings from Last 3 Encounters:  11/11/19 71.5 kg  10/22/19 73 kg  10/08/19 70.9 kg    Labs: Lab Results  Component Value Date   NA 143 10/01/2019   K 4.6 10/01/2019   CL 104 10/01/2019   CO2 24 09/23/2019   GLUCOSE 53 (L) 10/01/2019   BUN 64 (H) 10/01/2019    CREATININE 4.00 (H) 10/01/2019   CALCIUM 8.8 (L) 09/23/2019   Lab Results  Component Value Date   INR 2.4 10/27/2019   No results found for: CHOL, HDL, LDLCALC, TRIG   GEN- The patient is well appearing, alert and oriented x 3 today.   Head- normocephalic, atraumatic Eyes-  Sclera clear, conjunctiva pink Ears- hearing intact, bruising left ear  Oropharynx- clear Neck- supple, no JVP Lymph- no cervical lymphadenopathy Lungs- Clear to ausculation bilaterally, normal work of breathing Heart- irregular rate and rhythm, no murmurs, rubs or gallops, PMI not laterally displaced GI- soft, NT, ND, + BS, bruising rt flank  Extremities- no clubbing, cyanosis, or edema MS- no significant deformity or atrophy Skin- no rash or lesion Psych- euthymic mood, full affect Neuro- strength and sensation are intact  EKG-afib at 76  bpm qrs int 124  ms, qtc 499 ms   Echo- 1. Left ventricular ejection fraction, by estimation, is 55 to 60%. The  left ventricle has normal function. The left ventricle has no regional  wall motion abnormalities. There is mild left ventricular hypertrophy of  the posterior segment. Left  ventricular diastolic parameters are indeterminate. Elevated left  ventricular end-diastolic pressure.  2. Right ventricular systolic function is mildly reduced. The right  ventricular size is normal. There is normal pulmonary artery systolic  pressure.  3. Left atrial size was mildly dilated.  4. The mitral valve is grossly normal. Mild mitral valve regurgitation.  5. The aortic valve is tricuspid. Aortic valve regurgitation  is not  visualized. No aortic stenosis is present.  6. The inferior vena cava is normal in size with greater than 50%  respiratory variability, suggesting right atrial pressure of 3 mmHg.   Comparison(s): Echocardiogram done 3/0/17 showed an EF of 54%.   Conclusion(s)/Recommendation(s): Patient was tachycardic throughout the  study. Consider  contrast-enhancement at time of next echocardiogram for a  more accurate assessment of regional wall motion.   FINDINGS  Left Ventricle: Left ventricular ejection fraction, by estimation, is 55  to 60%. The left ventricle has normal function. The left ventricle has no  regional wall motion abnormalities. The left ventricular internal cavity  size was normal in size. There is  mild left ventricular hypertrophy of the posterior segment. Left  ventricular diastolic parameters are indeterminate. Elevated left  ventricular end-diastolic pressure.   Assessment and Plan: 1. Persistent afib:  Successful cardioversion 7/15 but ERAF She is rate controlled in afib today and feels great but I feel we should at least try one more cardioversion to see if SR can be restored  She will require TEE guided cardioversion as she had a 15 day gap in her weekly  INR's  INR today and am of cardioversion with a CHA2DS2VASc score of 4  Continue metoprolol at 25 mg bid and Cardizem at 120 mg bid  Bmet/cbc/covid   F/u afib clinic one week after cardioversion   Butch Penny C. Pallie Swigert, [facility] [address] [place], [place] [address] [phone]

## 2019-11-11 NOTE — Patient Instructions (Signed)
Cardioversion scheduled for Tuesday, August 31st  - Go for INR prior to arrival here  - Arrive at the [facility] and go to admitting at 1130AM  - Do not eat or drink anything after midnight the night prior to your procedure.  - Take all your morning medication (except diabetic medications) with a sip of water prior to arrival.  - You will not be able to drive home after your procedure.  - Do NOT miss any doses of your blood thinner - if you should miss a dose please notify our office immediately.

## 2019-11-11 NOTE — Progress Notes (Signed)
Primary Care Physician: Curlene Labrum, MD Referring Physician: Levell July NP   Kerri Brown is a 74 y.o. female with a h/o PAF, ESRD that is being seen for persistent afib burden. She had a recent AV fistula placement for dialysis, initially in April and that is when the pt states since then her afib has persisted. She has h/o of onset of paroxysmal afib since 2017.  L fistula access placed  in anticipation of HD, she is on warfarin with a CHA2DS2VASc score of  at least 4. SHe was on DOAC prior to that and developed a blood clot in her rt brachial artery as she was cutting back on the drug due to price. BB was recently added and  increased to 100 mg bid and cardizem 120 mg was increased to 240 mg  daily added but pt has not picked up form the drugstore. She is rate controlled today. In afib she is very fatigued and dizzy and weak, She  states that she can not carry on like this. She did trip in the kitchen yesterday and hit her left ear on a drawer pull and bruised her rt flank.   F/u in afib clinic, 6/22. On last visit here we found her INR to be over 5. She  had not had her coumadin checked in some time. She was notified to hold her coumadin for several nights and is now seeing [name] in [place] for her INR and  2 days ago was 2.3. She is feeling much better now starting amiodarone and is rate controlled.   F/u 7/7. She had another fall a couple of days ago. She got her walker tangled up in a throw rug. She  was seen in the ED and dx with a rt foot sprain. She did not have LOC.  She has now had 4 therapeutic INR's and will now schedule  for a cardioversion as she has been loading on amiodarone approaching a month.  F/u in afib clinic, 7/22.  She had a successful cardioversion but unfortunately she is back in afib. She feels "great!" she was not aware that she was in afib today. No further falls. On amiodarone  200 mg daily.   F/u in afib clinic 8/25. She remains in afib but feels great. No  more dizziness or falls. She is rate controlled. She had the prior successful cardioversion but with ERAF. I discussed with her  last visit trying one additional cardioversion, now that she has been lon amiodarone  for an additional  month. . She was having weekly cardioversion's but they lapsed after  8/10 so she will need to have a TEE guided cardioversion. She is in agreement.   Today, she denies symptoms of palpitations, chest pain, shortness of breath, orthopnea, PND, lower extremity edema, dizziness, presyncope, syncope, or neurologic sequela. The patient is tolerating medications without difficulties and is otherwise without complaint today.   Past Medical History:  Diagnosis Date  . Anemia   . Arthritis   . Bronchitis    hx  . CKD (chronic kidney disease)    Stage 5 due to DM  . Constipation    occasional  . Diabetes ([place])    type 2  . GERD (gastroesophageal reflux disease)   . Hyperlipemia   . Hypertension   . PAF (paroxysmal atrial fibrillation) ([place])   . Upper limb arterial embolus ([place]) 04/11/2019   RUE, s/p thromboembolectomy ([facility])   Past Surgical History:  Procedure Laterality Date  .  AV FISTULA PLACEMENT Left 04/23/2019   Procedure: LEFT ARM ARTERIOVENOUS (AV) FISTULA CREATION;  Surgeon: Waynetta Sandy, MD;  Location: [facility];  Service: Vascular;  Laterality: Left;  . BASCILIC VEIN TRANSPOSITION Left 06/25/2019   Procedure: BASCILIC VEIN TRANSPOSITION LEFT ARM;  Surgeon: Waynetta Sandy, MD;  Location: [facility];  Service: Vascular;  Laterality: Left;  . BRACHIAL EMBOLECTOMY Right 04/11/2019  . CARDIOVERSION N/A 10/01/2019   Procedure: CARDIOVERSION;  Surgeon: Acie Fredrickson Wonda Cheng, MD;  Location: [facility] ENDOSCOPY;  Service: Cardiovascular;  Laterality: N/A;  . COLONOSCOPY    . KNEE ARTHROSCOPY Right   . LAPAROSCOPIC CHOLECYSTECTOMY    . TOTAL HIP ARTHROPLASTY Right     Current Outpatient Medications  Medication Sig Dispense Refill  . acetaminophen (TYLENOL)  500 MG tablet Take 500-1,000 mg by mouth daily as needed for moderate pain or headache.     . allopurinol (ZYLOPRIM) 100 MG tablet Take 100 mg by mouth at bedtime.     Marland Kitchen amiodarone (PACERONE) 200 MG tablet Take 1 tablet (200 mg total) by mouth daily. 30 tablet 3  . calcium acetate (PHOSLO) 667 MG capsule Take 667 mg by mouth in the morning and at bedtime.     . cholecalciferol (VITAMIN D) 1000 units tablet Take 1,000 Units by mouth daily.    Marland Kitchen diltiazem (CARDIZEM) 120 MG tablet Take 120 mg by mouth in the morning and at bedtime.    . furosemide (LASIX) 40 MG tablet Take 40 mg by mouth 2 (two) times daily.     Marland Kitchen LANTUS SOLOSTAR 100 UNIT/ML Solostar Pen Inject 45 Units into the skin daily.     . metoprolol tartrate (LOPRESSOR) 25 MG tablet Take 1 tablet (25 mg total) by mouth 2 (two) times daily. 180 tablet 3  . omeprazole (PRILOSEC) 20 MG capsule Take 1 capsule by mouth once daily (Patient taking differently: Take 20 mg by mouth daily. ) 90 capsule 1  . rosuvastatin (CRESTOR) 10 MG tablet Take 10 mg by mouth once a week.     . sodium bicarbonate 650 MG tablet Take 650 mg by mouth 3 (three) times daily.    Marland Kitchen warfarin (COUMADIN) 5 MG tablet Take 1 tablet (5 mg total) by mouth daily. (Patient taking differently: Take 2.5 mg by mouth every evening. ) 30 tablet 1   No current facility-administered medications for this encounter.    No Known Allergies  Social History   Socioeconomic History  . Marital status: Widowed    Spouse name: Not on file  . Number of children: Not on file  . Years of education: Not on file  . Highest education level: Not on file  Occupational History  . Not on file  Tobacco Use  . Smoking status: Former Smoker    Packs/day: 0.50    Years: 44.00    Pack years: 22.00    Types: Cigarettes    Start date: 05/04/1970    Quit date: 05/04/2014    Years since quitting: 5.5  . Smokeless tobacco: Never Used  Vaping Use  . Vaping Use: Never used  Substance and Sexual  Activity  . Alcohol use: Never    Alcohol/week: 0.0 standard drinks  . Drug use: Never  . Sexual activity: Not on file  Other Topics Concern  . Not on file  Social History Narrative  . Not on file   Social Determinants of Health   Financial Resource Strain:   . Difficulty of Paying Living Expenses: Not on file  Food Insecurity:   . Worried About Charity fundraiser in the Last Year: Not on file  . Ran Out of Food in the Last Year: Not on file  Transportation Needs:   . Lack of Transportation (Medical): Not on file  . Lack of Transportation (Non-Medical): Not on file  Physical Activity:   . Days of Exercise per Week: Not on file  . Minutes of Exercise per Session: Not on file  Stress:   . Feeling of Stress : Not on file  Social Connections:   . Frequency of Communication with Friends and Family: Not on file  . Frequency of Social Gatherings with Friends and Family: Not on file  . Attends Religious Services: Not on file  . Active Member of Clubs or Organizations: Not on file  . Attends Archivist Meetings: Not on file  . Marital Status: Not on file  Intimate Partner Violence:   . Fear of Current or Ex-Partner: Not on file  . Emotionally Abused: Not on file  . Physically Abused: Not on file  . Sexually Abused: Not on file    Family History  Problem Relation Age of Onset  . Heart disease Father        had a pacemaker  . Diabetes Father   . Diabetes Sister   . Diabetes Sister   . Diabetes Other     ROS- All systems are reviewed and negative except as per the HPI above  Physical Exam: Vitals:   11/11/19 1355  BP: 106/64  Pulse: 80  Weight: 71.5 kg  Height: 5\' 7"  (1.702 m)   Wt Readings from Last 3 Encounters:  11/11/19 71.5 kg  10/22/19 73 kg  10/08/19 70.9 kg    Labs: Lab Results  Component Value Date   NA 143 10/01/2019   K 4.6 10/01/2019   CL 104 10/01/2019   CO2 24 09/23/2019   GLUCOSE 53 (L) 10/01/2019   BUN 64 (H) 10/01/2019    CREATININE 4.00 (H) 10/01/2019   CALCIUM 8.8 (L) 09/23/2019   Lab Results  Component Value Date   INR 2.4 10/27/2019   No results found for: CHOL, HDL, LDLCALC, TRIG   GEN- The patient is well appearing, alert and oriented x 3 today.   Head- normocephalic, atraumatic Eyes-  Sclera clear, conjunctiva pink Ears- hearing intact, bruising left ear  Oropharynx- clear Neck- supple, no JVP Lymph- no cervical lymphadenopathy Lungs- Clear to ausculation bilaterally, normal work of breathing Heart- irregular rate and rhythm, no murmurs, rubs or gallops, PMI not laterally displaced GI- soft, NT, ND, + BS, bruising rt flank  Extremities- no clubbing, cyanosis, or edema MS- no significant deformity or atrophy Skin- no rash or lesion Psych- euthymic mood, full affect Neuro- strength and sensation are intact  EKG-afib at 76  bpm qrs int 124  ms, qtc 499 ms   Echo- 1. Left ventricular ejection fraction, by estimation, is 55 to 60%. The  left ventricle has normal function. The left ventricle has no regional  wall motion abnormalities. There is mild left ventricular hypertrophy of  the posterior segment. Left  ventricular diastolic parameters are indeterminate. Elevated left  ventricular end-diastolic pressure.  2. Right ventricular systolic function is mildly reduced. The right  ventricular size is normal. There is normal pulmonary artery systolic  pressure.  3. Left atrial size was mildly dilated.  4. The mitral valve is grossly normal. Mild mitral valve regurgitation.  5. The aortic valve is tricuspid. Aortic valve regurgitation  is not  visualized. No aortic stenosis is present.  6. The inferior vena cava is normal in size with greater than 50%  respiratory variability, suggesting right atrial pressure of 3 mmHg.   Comparison(s): Echocardiogram done 3/0/17 showed an EF of 54%.   Conclusion(s)/Recommendation(s): Patient was tachycardic throughout the  study. Consider  contrast-enhancement at time of next echocardiogram for a  more accurate assessment of regional wall motion.   FINDINGS  Left Ventricle: Left ventricular ejection fraction, by estimation, is 55  to 60%. The left ventricle has normal function. The left ventricle has no  regional wall motion abnormalities. The left ventricular internal cavity  size was normal in size. There is  mild left ventricular hypertrophy of the posterior segment. Left  ventricular diastolic parameters are indeterminate. Elevated left  ventricular end-diastolic pressure.   Assessment and Plan: 1. Persistent afib:  Successful cardioversion 7/15 but ERAF She is rate controlled in afib today and feels great but I feel we should at least try one more cardioversion to see if SR can be restored  She will require TEE guided cardioversion as she had a 15 day gap in her weekly  INR's  INR today and am of cardioversion with a CHA2DS2VASc score of 4  Continue metoprolol at 25 mg bid and Cardizem at 120 mg bid  Bmet/cbc/covid   F/u afib clinic one week after cardioversion   Butch Penny C. Jeniah Kishi, [facility] [address] [place], [place] [address] [phone]

## 2019-11-12 ENCOUNTER — Ambulatory Visit (INDEPENDENT_AMBULATORY_CARE_PROVIDER_SITE_OTHER): Payer: Medicare HMO | Admitting: *Deleted

## 2019-11-12 NOTE — Patient Instructions (Signed)
Pending TEE/DCCV 8/31 Amiodarone decreased to 1 tablet daily on 7/14 Increase warfarin to 1/2 tablet daily except 1 tablet on Thursdays Recheck on 8/31

## 2019-11-13 ENCOUNTER — Other Ambulatory Visit (HOSPITAL_COMMUNITY)
Admission: RE | Admit: 2019-11-13 | Discharge: 2019-11-13 | Disposition: A | Discharge status: Home / Self Care | Payer: Medicare HMO | Source: Ambulatory Visit | Attending: Cardiovascular Disease | Admitting: Cardiovascular Disease

## 2019-11-13 ENCOUNTER — Other Ambulatory Visit: Payer: Self-pay

## 2019-11-13 DIAGNOSIS — Z01812 Encounter for preprocedural laboratory examination: Secondary | ICD-10-CM | POA: Diagnosis not present

## 2019-11-13 DIAGNOSIS — Z20822 Contact with and (suspected) exposure to covid-19: Secondary | ICD-10-CM | POA: Diagnosis not present

## 2019-11-13 LAB — SARS CORONAVIRUS 2 (TAT 6-24 HRS): SARS Coronavirus 2: NEGATIVE

## 2019-11-17 ENCOUNTER — Ambulatory Visit (HOSPITAL_COMMUNITY): Payer: Medicare HMO | Admitting: Anesthesiology

## 2019-11-17 ENCOUNTER — Other Ambulatory Visit: Payer: Self-pay

## 2019-11-17 ENCOUNTER — Encounter (HOSPITAL_COMMUNITY): Payer: Self-pay | Admitting: Cardiovascular Disease

## 2019-11-17 ENCOUNTER — Ambulatory Visit (HOSPITAL_COMMUNITY)
Admission: RE | Admit: 2019-11-17 | Discharge: 2019-11-17 | Disposition: A | Discharge status: Home / Self Care | Payer: Medicare HMO | Attending: Cardiovascular Disease | Admitting: Cardiovascular Disease

## 2019-11-17 ENCOUNTER — Encounter (HOSPITAL_COMMUNITY)
Admission: RE | Disposition: A | Discharge status: Home / Self Care | Payer: Self-pay | Source: Home / Self Care | Attending: Cardiovascular Disease

## 2019-11-17 ENCOUNTER — Ambulatory Visit (HOSPITAL_BASED_OUTPATIENT_CLINIC_OR_DEPARTMENT_OTHER): Payer: Medicare HMO

## 2019-11-17 ENCOUNTER — Ambulatory Visit (INDEPENDENT_AMBULATORY_CARE_PROVIDER_SITE_OTHER): Payer: Medicare HMO | Admitting: *Deleted

## 2019-11-17 DIAGNOSIS — I4891 Unspecified atrial fibrillation: Secondary | ICD-10-CM | POA: Diagnosis not present

## 2019-11-17 DIAGNOSIS — I480 Paroxysmal atrial fibrillation: Secondary | ICD-10-CM | POA: Diagnosis not present

## 2019-11-17 DIAGNOSIS — Z87891 Personal history of nicotine dependence: Secondary | ICD-10-CM | POA: Diagnosis not present

## 2019-11-17 DIAGNOSIS — Z794 Long term (current) use of insulin: Secondary | ICD-10-CM | POA: Diagnosis not present

## 2019-11-17 DIAGNOSIS — N184 Chronic kidney disease, stage 4 (severe): Secondary | ICD-10-CM | POA: Diagnosis not present

## 2019-11-17 DIAGNOSIS — I10 Essential (primary) hypertension: Secondary | ICD-10-CM | POA: Insufficient documentation

## 2019-11-17 DIAGNOSIS — I4819 Other persistent atrial fibrillation: Secondary | ICD-10-CM

## 2019-11-17 DIAGNOSIS — Z5181 Encounter for therapeutic drug level monitoring: Secondary | ICD-10-CM

## 2019-11-17 DIAGNOSIS — N186 End stage renal disease: Secondary | ICD-10-CM | POA: Insufficient documentation

## 2019-11-17 DIAGNOSIS — Z7901 Long term (current) use of anticoagulants: Secondary | ICD-10-CM | POA: Diagnosis not present

## 2019-11-17 DIAGNOSIS — E785 Hyperlipidemia, unspecified: Secondary | ICD-10-CM | POA: Insufficient documentation

## 2019-11-17 DIAGNOSIS — E1122 Type 2 diabetes mellitus with diabetic chronic kidney disease: Secondary | ICD-10-CM | POA: Diagnosis not present

## 2019-11-17 DIAGNOSIS — Z79899 Other long term (current) drug therapy: Secondary | ICD-10-CM | POA: Diagnosis not present

## 2019-11-17 DIAGNOSIS — K219 Gastro-esophageal reflux disease without esophagitis: Secondary | ICD-10-CM | POA: Insufficient documentation

## 2019-11-17 DIAGNOSIS — I129 Hypertensive chronic kidney disease with stage 1 through stage 4 chronic kidney disease, or unspecified chronic kidney disease: Secondary | ICD-10-CM | POA: Diagnosis not present

## 2019-11-17 DIAGNOSIS — Z96641 Presence of right artificial hip joint: Secondary | ICD-10-CM | POA: Insufficient documentation

## 2019-11-17 DIAGNOSIS — I340 Nonrheumatic mitral (valve) insufficiency: Secondary | ICD-10-CM | POA: Diagnosis not present

## 2019-11-17 HISTORY — PX: TEE WITHOUT CARDIOVERSION: SHX5443

## 2019-11-17 HISTORY — PX: CARDIOVERSION: SHX1299

## 2019-11-17 LAB — POCT INR: INR: 3.2 — AB (ref 2.0–3.0)

## 2019-11-17 LAB — GLUCOSE, CAPILLARY
Glucose-Capillary: 59 mg/dL — ABNORMAL LOW (ref 70–99)
Glucose-Capillary: 132 mg/dL — ABNORMAL HIGH (ref 70–99)

## 2019-11-17 SURGERY — ECHOCARDIOGRAM, TRANSESOPHAGEAL
Anesthesia: General

## 2019-11-17 MED ORDER — DEXTROSE 50 % IV SOLN
25.0000 mL | Freq: Once | INTRAVENOUS | Status: AC
  Administered 2019-11-17: 25 mL via INTRAVENOUS

## 2019-11-17 MED ORDER — DEXTROSE 50 % IV SOLN
INTRAVENOUS | Status: AC
  Filled 2019-11-17: qty 50

## 2019-11-17 MED ORDER — PROPOFOL 500 MG/50ML IV EMUL
INTRAVENOUS | Status: DC | PRN
  Administered 2019-11-17: 75 ug/kg/min via INTRAVENOUS

## 2019-11-17 MED ORDER — SODIUM CHLORIDE 0.9 % IV SOLN: INTRAVENOUS | Status: DC

## 2019-11-17 MED ORDER — PROPOFOL 10 MG/ML IV BOLUS
INTRAVENOUS | Status: DC | PRN
  Administered 2019-11-17: 20 mg via INTRAVENOUS

## 2019-11-17 NOTE — Discharge Instructions (Signed)
Electrical Cardioversion Electrical cardioversion is the delivery of a jolt of electricity to restore a normal rhythm to the heart. A rhythm that is too fast or is not regular keeps the heart from pumping well. In this procedure, sticky patches or metal paddles are placed on the chest to deliver electricity to the heart from a device. This procedure may be done in an emergency if:  There is low or no blood pressure as a result of the heart rhythm.  Normal rhythm must be restored as fast as possible to protect the brain and heart from further damage.  It may save a life. This may also be a scheduled procedure for irregular or fast heart rhythms that are not immediately life-threatening. Tell a health care provider about:  Any allergies you have.  All medicines you are taking, including vitamins, herbs, eye drops, creams, and over-the-counter medicines.  Any problems you or family members have had with anesthetic medicines.  Any blood disorders you have.  Any surgeries you have had.  Any medical conditions you have.  Whether you are pregnant or may be pregnant. What are the risks? Generally, this is a safe procedure. However, problems may occur, including:  Allergic reactions to medicines.  A blood clot that breaks free and travels to other parts of your body.  The possible return of an abnormal heart rhythm within hours or days after the procedure.  Your heart stopping (cardiac arrest). This is rare. What happens before the procedure? Medicines  Your health care provider may have you start taking: ? Blood-thinning medicines (anticoagulants) so your blood does not clot as easily. ? Medicines to help stabilize your heart rate and rhythm.  Ask your health care provider about: ? Changing or stopping your regular medicines. This is especially important if you are taking diabetes medicines or blood thinners. ? Taking medicines such as aspirin and ibuprofen. These medicines can  thin your blood. Do not take these medicines unless your health care provider tells you to take them. ? Taking over-the-counter medicines, vitamins, herbs, and supplements. General instructions  Follow instructions from your health care provider about eating or drinking restrictions.  Plan to have someone take you home from the hospital or clinic.  If you will be going home right after the procedure, plan to have someone with you for 24 hours.  Ask your health care provider what steps will be taken to help prevent infection. These may include washing your skin with a germ-killing soap. What happens during the procedure?   An IV will be inserted into one of your veins.  Sticky patches (electrodes) or metal paddles may be placed on your chest.  You will be given a medicine to help you relax (sedative).  An electrical shock will be delivered. The procedure may vary among health care providers and hospitals. What can I expect after the procedure?  Your blood pressure, heart rate, breathing rate, and blood oxygen level will be monitored until you leave the hospital or clinic.  Your heart rhythm will be watched to make sure it does not change.  You may have some redness on the skin where the shocks were given. Follow these instructions at home:  Do not drive for 24 hours if you were given a sedative during your procedure.  Take over-the-counter and prescription medicines only as told by your health care provider.  Ask your health care provider how to check your pulse. Check it often.  Rest for 48 hours after the procedure or   as told by your health care provider.  Avoid or limit your caffeine use as told by your health care provider.  Keep all follow-up visits as told by your health care provider. This is important. Contact a health care provider if:  You feel like your heart is beating too quickly or your pulse is not regular.  You have a serious muscle cramp that does not go  away. Get help right away if:  You have discomfort in your chest.  You are dizzy or you feel faint.  You have trouble breathing or you are short of breath.  Your speech is slurred.  You have trouble moving an arm or leg on one side of your body.  Your fingers or toes turn cold or blue. Summary  Electrical cardioversion is the delivery of a jolt of electricity to restore a normal rhythm to the heart.  This procedure may be done right away in an emergency or may be a scheduled procedure if the condition is not an emergency.  Generally, this is a safe procedure.  After the procedure, check your pulse often as told by your health care provider. This information is not intended to replace advice given to you by your health care provider. Make sure you discuss any questions you have with your health care provider. Document Revised: 10/06/2018 Document Reviewed: 10/06/2018 Elsevier Patient Education  2020 [employer].  

## 2019-11-17 NOTE — Anesthesia Preprocedure Evaluation (Addendum)
Anesthesia Evaluation  Patient identified by MRN, date of birth, ID band Patient awake    Reviewed: Allergy & Precautions, NPO status , Patient's Chart, lab work & pertinent test results  Airway Mallampati: I  TM Distance: >3 FB Neck ROM: Full    Dental   Pulmonary former smoker,    Pulmonary exam normal        Cardiovascular hypertension, Pt. on medications Normal cardiovascular exam     Neuro/Psych    GI/Hepatic GERD  ,  Endo/Other  diabetes, Type 2, Insulin Dependent  Renal/GU Renal InsufficiencyRenal disease     Musculoskeletal   Abdominal   Peds  Hematology   Anesthesia Other Findings   Reproductive/Obstetrics                             Anesthesia Physical Anesthesia Plan  ASA: III  Anesthesia Plan: MAC   Post-op Pain Management:    Induction: Intravenous  PONV Risk Score and Plan: 2 and Treatment may vary due to age or medical condition and Propofol infusion  Airway Management Planned: Nasal Cannula  Additional Equipment: None  Intra-op Plan:   Post-operative Plan:   Informed Consent: I have reviewed the patients History and Physical, chart, labs and discussed the procedure including the risks, benefits and alternatives for the proposed anesthesia with the patient or authorized representative who has indicated his/her understanding and acceptance.     Dental advisory given  Plan Discussed with: CRNA  Anesthesia Plan Comments:         Anesthesia Quick Evaluation

## 2019-11-17 NOTE — Interval H&P Note (Signed)
History and Physical Interval Note:  11/17/2019 9:59 AM  Kerri Brown  has presented today for surgery, with the diagnosis of AFIB.  The various methods of treatment have been discussed with the patient and family. After consideration of risks, benefits and other options for treatment, the patient has consented to  Procedure(s): TRANSESOPHAGEAL ECHOCARDIOGRAM (TEE) (N/A) CARDIOVERSION (N/A) as a surgical intervention.  The patient's history has been reviewed, patient examined, no change in status, stable for surgery.  I have reviewed the patient's chart and labs.  Questions were answered to the patient's satisfaction.     Jenkins Rouge

## 2019-11-17 NOTE — Progress Notes (Signed)
Pt's HR 35-48  After cardioversion. Dr. Johnsie Cancel made aware, 12 Lead EKG reviewed. Pt asymptomatic. Per Dr. Johnsie Cancel pt to hold Metoprolol and advised to call Roderic Palau with the afib clinic. Jobe Igo, RN

## 2019-11-17 NOTE — Interval H&P Note (Signed)
History and Physical Interval Note:  11/17/2019 10:28 AM  Kerri Brown  has presented today for surgery, with the diagnosis of AFIB.  The various methods of treatment have been discussed with the patient and family. After consideration of risks, benefits and other options for treatment, the patient has consented to  Procedure(s): TRANSESOPHAGEAL ECHOCARDIOGRAM (TEE) (N/A) CARDIOVERSION (N/A) as a surgical intervention.  The patient's history has been reviewed, patient examined, no change in status, stable for surgery.  I have reviewed the patient's chart and labs.  Questions were answered to the patient's satisfaction.     Jenkins Rouge

## 2019-11-17 NOTE — Progress Notes (Signed)
  Echocardiogram Echocardiogram Transesophageal has been performed.  Kerri Brown 11/17/2019, 12:33 PM

## 2019-11-17 NOTE — Progress Notes (Signed)
Hypoglycemic Event  CBG: 59  Treatment: D50 50 mL (25 gm)  Symptoms: None  Follow-up CBG: [id number] CBG Result:132  Possible Reasons for Event: Inadequate meal intake  Comments/MD notified:yes    Belinda Block

## 2019-11-17 NOTE — CV Procedure (Signed)
TEE/DCC: Anesthesia;  [facility] Propofol Rx INR 3.2  EF 55-60% No LAA thrombus Mild MR Bi atrial enlargement  Normal RV Normal AV Mild PR No effusion   DCC x 1 150 J converted from afib rate 110 to NSR rate 58 bpm No immediate neurologic sequelae  Jenkins Rouge MD [facility]

## 2019-11-17 NOTE — Patient Instructions (Signed)
Pending TEE/DCCV 8/31 Amiodarone decreased to 1 tablet daily on 7/14 Decrease warfarin back to 1/2 tablet daily Recheck on 9/8 at [facility]

## 2019-11-17 NOTE — Interval H&P Note (Signed)
History and Physical Interval Note:  11/17/2019 10:01 AM  Kaili C Brabender  has presented today for surgery, with the diagnosis of AFIB.  The various methods of treatment have been discussed with the patient and family. After consideration of risks, benefits and other options for treatment, the patient has consented to  Procedure(s): TRANSESOPHAGEAL ECHOCARDIOGRAM (TEE) (N/A) CARDIOVERSION (N/A) as a surgical intervention.  The patient's history has been reviewed, patient examined, no change in status, stable for surgery.  I have reviewed the patient's chart and labs.  Questions were answered to the patient's satisfaction.     Jenkins Rouge

## 2019-11-17 NOTE — Transfer of Care (Signed)
Immediate Anesthesia Transfer of Care Note  Patient: Kerri Brown  Procedure(s) Performed: TRANSESOPHAGEAL ECHOCARDIOGRAM (TEE) (N/A ) CARDIOVERSION (N/A )  Patient Location: Endoscopy Unit  Anesthesia Type:MAC  Level of Consciousness: drowsy and patient cooperative  Airway & Oxygen Therapy: Patient Spontanous Breathing and Patient connected to nasal cannula oxygen  Post-op Assessment: Report given to RN, Post -op Vital signs reviewed and stable and Patient moving all extremities X 4  Post vital signs: Reviewed and stable  Last Vitals:  Vitals Value Taken Time  BP 103/33 11/17/19 1228  Temp    Pulse 39 11/17/19 1229  Resp 12 11/17/19 1229  SpO2 99 % 11/17/19 1229  Vitals shown include unvalidated device data.  Last Pain:  Vitals:   11/17/19 1011  TempSrc: Oral  PainSc: 0-No pain         Complications: No complications documented.

## 2019-11-18 ENCOUNTER — Telehealth (HOSPITAL_COMMUNITY): Payer: Self-pay | Admitting: *Deleted

## 2019-11-18 NOTE — Telephone Encounter (Signed)
Patient called with update of HRs since cardioversion - today HR 80 feeling good. She has not taken any metoprolol since cardioversion yesterday.  Discussed with Roderic Palau NP will resume metoprolol at 12.5mg  twice a day call tomorrow with update of HRs.

## 2019-11-19 ENCOUNTER — Encounter (HOSPITAL_COMMUNITY): Payer: Self-pay | Admitting: Cardiovascular Disease

## 2019-11-19 MED ORDER — METOPROLOL TARTRATE 25 MG PO TABS: 12.5000 mg | ORAL_TABLET | Freq: Two times a day (BID) | ORAL | 3 refills | Status: AC

## 2019-11-19 NOTE — Anesthesia Postprocedure Evaluation (Signed)
Anesthesia Post Note  Patient: Kerri Brown  Procedure(s) Performed: TRANSESOPHAGEAL ECHOCARDIOGRAM (TEE) (N/A ) CARDIOVERSION (N/A )     Patient location during evaluation: Endoscopy Anesthesia Type: General Level of consciousness: patient cooperative and awake Pain management: pain level controlled Vital Signs Assessment: post-procedure vital signs reviewed and stable Respiratory status: spontaneous breathing, nonlabored ventilation, respiratory function stable and patient connected to nasal cannula oxygen Cardiovascular status: stable Postop Assessment: no apparent nausea or vomiting Anesthetic complications: no   No complications documented.  Last Vitals:  Vitals:   11/17/19 1011 11/17/19 1231  BP: (!) 137/57 (!) 103/33  Pulse: 72   Resp: (!) 28 16  Temp: 36.9 C 36.4 C  SpO2: 99% 98%    Last Pain:  Vitals:   11/17/19 1258  TempSrc:   PainSc: 0-No pain                 Zaviyar Rahal

## 2019-11-19 NOTE — Telephone Encounter (Signed)
Pt HR 69 BP 116/72 feeling well. She will continue metoprolol at 1/2 tablet (12.5mg ) twice a day until follow up next week.

## 2019-11-20 DIAGNOSIS — E872 Acidosis: Secondary | ICD-10-CM | POA: Diagnosis not present

## 2019-11-20 DIAGNOSIS — E1122 Type 2 diabetes mellitus with diabetic chronic kidney disease: Secondary | ICD-10-CM | POA: Diagnosis not present

## 2019-11-20 DIAGNOSIS — N185 Chronic kidney disease, stage 5: Secondary | ICD-10-CM | POA: Diagnosis not present

## 2019-11-20 DIAGNOSIS — I129 Hypertensive chronic kidney disease with stage 1 through stage 4 chronic kidney disease, or unspecified chronic kidney disease: Secondary | ICD-10-CM | POA: Diagnosis not present

## 2019-11-20 DIAGNOSIS — E1129 Type 2 diabetes mellitus with other diabetic kidney complication: Secondary | ICD-10-CM | POA: Diagnosis not present

## 2019-11-20 DIAGNOSIS — E211 Secondary hyperparathyroidism, not elsewhere classified: Secondary | ICD-10-CM | POA: Diagnosis not present

## 2019-11-20 DIAGNOSIS — R809 Proteinuria, unspecified: Secondary | ICD-10-CM | POA: Diagnosis not present

## 2019-11-24 ENCOUNTER — Ambulatory Visit (INDEPENDENT_AMBULATORY_CARE_PROVIDER_SITE_OTHER): Payer: Medicare HMO | Admitting: Pharmacist

## 2019-11-24 DIAGNOSIS — Z5181 Encounter for therapeutic drug level monitoring: Secondary | ICD-10-CM | POA: Diagnosis not present

## 2019-11-24 DIAGNOSIS — I4819 Other persistent atrial fibrillation: Secondary | ICD-10-CM | POA: Diagnosis not present

## 2019-11-24 LAB — POCT INR: INR: 2.8 (ref 2.0–3.0)

## 2019-11-24 NOTE — Patient Instructions (Signed)
Description   Continue taking 1/2 tablet daily Recheck INR in 3 weeks

## 2019-11-25 ENCOUNTER — Encounter (HOSPITAL_COMMUNITY): Payer: Self-pay | Admitting: Nurse Practitioner

## 2019-11-25 ENCOUNTER — Other Ambulatory Visit: Payer: Self-pay

## 2019-11-25 ENCOUNTER — Ambulatory Visit (HOSPITAL_COMMUNITY)
Admission: RE | Admit: 2019-11-25 | Discharge: 2019-11-25 | Disposition: A | Discharge status: Home / Self Care | Payer: Medicare HMO | Source: Ambulatory Visit | Attending: Nurse Practitioner | Admitting: Nurse Practitioner

## 2019-11-25 VITALS — BP 128/76 | HR 70 | Ht 67.0 in | Wt 161.6 lb

## 2019-11-25 DIAGNOSIS — I4819 Other persistent atrial fibrillation: Secondary | ICD-10-CM | POA: Diagnosis not present

## 2019-11-25 DIAGNOSIS — Z794 Long term (current) use of insulin: Secondary | ICD-10-CM | POA: Diagnosis not present

## 2019-11-25 DIAGNOSIS — E1122 Type 2 diabetes mellitus with diabetic chronic kidney disease: Secondary | ICD-10-CM | POA: Diagnosis not present

## 2019-11-25 DIAGNOSIS — N186 End stage renal disease: Secondary | ICD-10-CM | POA: Insufficient documentation

## 2019-11-25 DIAGNOSIS — K219 Gastro-esophageal reflux disease without esophagitis: Secondary | ICD-10-CM | POA: Insufficient documentation

## 2019-11-25 DIAGNOSIS — D6869 Other thrombophilia: Secondary | ICD-10-CM

## 2019-11-25 DIAGNOSIS — Z79899 Other long term (current) drug therapy: Secondary | ICD-10-CM | POA: Diagnosis not present

## 2019-11-25 DIAGNOSIS — Z992 Dependence on renal dialysis: Secondary | ICD-10-CM | POA: Insufficient documentation

## 2019-11-25 DIAGNOSIS — Z96641 Presence of right artificial hip joint: Secondary | ICD-10-CM | POA: Diagnosis not present

## 2019-11-25 DIAGNOSIS — I120 Hypertensive chronic kidney disease with stage 5 chronic kidney disease or end stage renal disease: Secondary | ICD-10-CM | POA: Insufficient documentation

## 2019-11-25 DIAGNOSIS — E785 Hyperlipidemia, unspecified: Secondary | ICD-10-CM | POA: Diagnosis not present

## 2019-11-25 DIAGNOSIS — Z7901 Long term (current) use of anticoagulants: Secondary | ICD-10-CM | POA: Diagnosis not present

## 2019-11-25 MED ORDER — AMIODARONE HCL 200 MG PO TABS
100.0000 mg | ORAL_TABLET | Freq: Every day | ORAL | 3 refills | Status: DC
Start: 2019-11-25 — End: 2019-12-28

## 2019-11-25 NOTE — Progress Notes (Signed)
Primary Care Physician: Kerri Labrum, MD Referring Physician: Levell July NP Cardiologist: Dr. Johnny Brown    Kerri Brown is a 74 y.o. female with a h/o PAF, ESRD that is being seen for persistent afib burden. She had a recent AV fistula placement for dialysis, initially in April and that is when the pt states since then her afib has persisted. She has h/o of onset of paroxysmal afib since 2017.  L fistula access placed  in anticipation of HD, she is on warfarin with a CHA2DS2VASc score of  at least 4. SHe was on DOAC prior to that and developed a blood clot in her rt brachial artery as she was cutting back on the drug due to price. BB was recently added and  increased to 100 mg bid and cardizem 120 mg was increased to 240 mg  daily added but pt has not picked up form the drugstore. She is rate controlled today. In afib she is very fatigued and dizzy and weak, She  states that she can not carry on like this. She did trip in the kitchen yesterday and hit her left ear on a drawer pull and bruised her rt flank.   F/u in afib clinic, 6/22. On last visit here we found her INR to be over 5. She  had not had her coumadin checked in some time. She was notified to hold her coumadin for several nights and is now seeing Kerri Brown in [place] for her INR and  2 days ago was 2.3. She is feeling much better now starting amiodarone and is rate controlled.   F/u 7/7. She had another fall a couple of days ago. She got her walker tangled up in a throw rug. She  was seen in the ED and dx with a rt foot sprain. She did not have LOC.  She has now had 4 therapeutic INR's and will now schedule  for a cardioversion as she has been loading on amiodarone approaching a month.  F/u in afib clinic, 7/22.  She had a successful cardioversion but unfortunately she is back in afib. She feels "great!" she was not aware that she was in afib today. No further falls. On amiodarone  200 mg daily.   F/u in afib clinc, 9/8. On last  visit, I discussed with pt trying one more cardioversion to see if we could get SR to "stick." She did have another cardioversion 8/31, which was successful but now has returned to rate controlled afib. She can not tell the difference in being in afib vrs SR. She still says she feels great! I feel the rate control of the amiodarone did help without dropping her BP and helped her overall if only for the rate control.   Today, she denies symptoms of palpitations, chest pain, shortness of breath, orthopnea, PND, lower extremity edema, dizziness, presyncope, syncope, or neurologic sequela. The patient is tolerating medications without difficulties and is otherwise without complaint today.   Past Medical History:  Diagnosis Date  . Anemia   . Arthritis   . Bronchitis    hx  . CKD (chronic kidney disease)    Stage 5 due to DM  . Constipation    occasional  . Diabetes ([place])    type 2  . GERD (gastroesophageal reflux disease)   . Hyperlipemia   . Hypertension   . PAF (paroxysmal atrial fibrillation) ([place])   . Upper limb arterial embolus ([place]) 04/11/2019   RUE, s/p thromboembolectomy ([facility])  Past Surgical History:  Procedure Laterality Date  . AV FISTULA PLACEMENT Left 04/23/2019   Procedure: LEFT ARM ARTERIOVENOUS (AV) FISTULA CREATION;  Surgeon: Kerri Sandy, MD;  Location: [facility];  Service: Vascular;  Laterality: Left;  . BASCILIC VEIN TRANSPOSITION Left 06/25/2019   Procedure: BASCILIC VEIN TRANSPOSITION LEFT ARM;  Surgeon: Kerri Sandy, MD;  Location: [facility];  Service: Vascular;  Laterality: Left;  . BRACHIAL EMBOLECTOMY Right 04/11/2019  . CARDIOVERSION N/A 10/01/2019   Procedure: CARDIOVERSION;  Surgeon: Kerri Fredrickson Wonda Cheng, MD;  Location[facility] ENDOSCOPY;  Service: Cardiovascular;  Laterality: N/A;  . CARDIOVERSION N/A 11/17/2019   Procedure: CARDIOVERSION;  Surgeon: Kerri Hector, MD;  Location: [facility] ENDOSCOPY;  Service: Cardiovascular;  Laterality: N/A;  .  COLONOSCOPY    . KNEE ARTHROSCOPY Right   . LAPAROSCOPIC CHOLECYSTECTOMY    . TEE WITHOUT CARDIOVERSION N/A 11/17/2019   Procedure: TRANSESOPHAGEAL ECHOCARDIOGRAM (TEE);  Surgeon: Kerri Hector, MD;  Location: [facility] ENDOSCOPY;  Service: Cardiovascular;  Laterality: N/A;  . TOTAL HIP ARTHROPLASTY Right     Current Outpatient Medications  Medication Sig Dispense Refill  . acetaminophen (TYLENOL) 500 MG tablet Take 500-1,000 mg by mouth as needed for moderate pain or headache.     . allopurinol (ZYLOPRIM) 100 MG tablet Take 100 mg by mouth at bedtime.     Marland Kitchen amiodarone (PACERONE) 200 MG tablet Take 0.5 tablets (100 mg total) by mouth daily. 30 tablet 3  . calcium acetate (PHOSLO) 667 MG capsule Take 667 mg by mouth 2 (two) times daily with a meal.     . cholecalciferol (VITAMIN D) 1000 units tablet Take 1,000 Units by mouth daily.    Marland Kitchen diltiazem (CARDIZEM) 120 MG tablet Take 120 mg by mouth in the morning and at bedtime.    . furosemide (LASIX) 40 MG tablet Take 40 mg by mouth 2 (two) times daily.     Marland Kitchen LANTUS SOLOSTAR 100 UNIT/ML Solostar Pen Inject 45 Units into the skin daily.     . metoprolol tartrate (LOPRESSOR) 25 MG tablet Take 0.5 tablets (12.5 mg total) by mouth 2 (two) times daily. 180 tablet 3  . omeprazole (PRILOSEC) 20 MG capsule Take 1 capsule by mouth once daily (Patient taking differently: Take 20 mg by mouth daily. ) 90 capsule 1  . rosuvastatin (CRESTOR) 10 MG tablet Take 10 mg by mouth once a week. Friday    . sodium bicarbonate 650 MG tablet Take 650 mg by mouth 3 (three) times daily.    Marland Kitchen warfarin (COUMADIN) 5 MG tablet Take 1 tablet (5 mg total) by mouth daily. (Patient taking differently: Take 2.5 mg by mouth every evening. ) 30 tablet 1   No current facility-administered medications for this encounter.    No Known Allergies  Social History   Socioeconomic History  . Marital status: Widowed    Spouse name: Not on file  . Number of children: Not on file  . Years of  education: Not on file  . Highest education level: Not on file  Occupational History  . Not on file  Tobacco Use  . Smoking status: Former Smoker    Packs/day: 0.50    Years: 44.00    Pack years: 22.00    Types: Cigarettes    Start date: 05/04/1970    Quit date: 05/04/2014    Years since quitting: 5.5  . Smokeless tobacco: Never Used  Vaping Use  . Vaping Use: Never used  Substance and Sexual Activity  .  Alcohol use: Never    Alcohol/week: 0.0 standard drinks  . Drug use: Never  . Sexual activity: Not on file  Other Topics Concern  . Not on file  Social History Narrative  . Not on file   Social Determinants of Health   Financial Resource Strain:   . Difficulty of Paying Living Expenses: Not on file  Food Insecurity:   . Worried About Charity fundraiser in the Last Year: Not on file  . Ran Out of Food in the Last Year: Not on file  Transportation Needs:   . Lack of Transportation (Medical): Not on file  . Lack of Transportation (Non-Medical): Not on file  Physical Activity:   . Days of Exercise per Week: Not on file  . Minutes of Exercise per Session: Not on file  Stress:   . Feeling of Stress : Not on file  Social Connections:   . Frequency of Communication with Friends and Family: Not on file  . Frequency of Social Gatherings with Friends and Family: Not on file  . Attends Religious Services: Not on file  . Active Member of Clubs or Organizations: Not on file  . Attends Archivist Meetings: Not on file  . Marital Status: Not on file  Intimate Partner Violence:   . Fear of Current or Ex-Partner: Not on file  . Emotionally Abused: Not on file  . Physically Abused: Not on file  . Sexually Abused: Not on file    Family History  Problem Relation Age of Onset  . Heart disease Father        had a pacemaker  . Diabetes Father   . Diabetes Sister   . Diabetes Sister   . Diabetes Other     ROS- All systems are reviewed and negative except as per the  HPI above  Physical Exam: Vitals:   11/25/19 1355  BP: 128/76  Pulse: 70  Weight: 73.3 kg  Height: 5\' 7"  (1.702 m)   Wt Readings from Last 3 Encounters:  11/25/19 73.3 kg  11/11/19 71.5 kg  10/22/19 73 kg    Labs: Lab Results  Component Value Date   NA 140 11/11/2019   K 4.8 11/11/2019   CL 108 11/11/2019   CO2 19 (L) 11/11/2019   GLUCOSE 154 (H) 11/11/2019   BUN 56 (H) 11/11/2019   CREATININE 4.12 (H) 11/11/2019   CALCIUM 9.3 11/11/2019   Lab Results  Component Value Date   INR 2.8 11/24/2019   No results found for: CHOL, HDL, LDLCALC, TRIG   GEN- The patient is well appearing, alert and oriented x 3 today.   Head- normocephalic, atraumatic Eyes-  Sclera clear, conjunctiva pink Ears- hearing intact, bruising left ear  Oropharynx- clear Neck- supple, no JVP Lymph- no cervical lymphadenopathy Lungs- Clear to ausculation bilaterally, normal work of breathing Heart- irregular rate and rhythm, no murmurs, rubs or gallops, PMI not laterally displaced GI- soft, NT, ND, + BS, bruising rt flank  Extremities- no clubbing, cyanosis, or edema MS- no significant deformity or atrophy Skin- no rash or lesion Psych- euthymic mood, full affect Neuro- strength and sensation are intact  EKG-afib at 70  bpm qrs int 128   ms, qtc 479  ms   Echo- 1. Left ventricular ejection fraction, by estimation, is 55 to 60%. The  left ventricle has normal function. The left ventricle has no regional  wall motion abnormalities. There is mild left ventricular hypertrophy of  the posterior segment. Left  ventricular diastolic parameters are indeterminate. Elevated left  ventricular end-diastolic pressure.  2. Right ventricular systolic function is mildly reduced. The right  ventricular size is normal. There is normal pulmonary artery systolic  pressure.  3. Left atrial size was mildly dilated.  4. The mitral valve is grossly normal. Mild mitral valve regurgitation.  5. The aortic  valve is tricuspid. Aortic valve regurgitation is not  visualized. No aortic stenosis is present.  6. The inferior vena cava is normal in size with greater than 50%  respiratory variability, suggesting right atrial pressure of 3 mmHg.   Comparison(s): Echocardiogram done 3/0/17 showed an EF of 54%.   Conclusion(s)/Recommendation(s): Patient was tachycardic throughout the  study. Consider contrast-enhancement at time of next echocardiogram for a  more accurate assessment of regional wall motion.   FINDINGS  Left Ventricle: Left ventricular ejection fraction, by estimation, is 55  to 60%. The left ventricle has normal function. The left ventricle has no  regional wall motion abnormalities. The left ventricular internal cavity  size was normal in size. There is  mild left ventricular hypertrophy of the posterior segment. Left  ventricular diastolic parameters are indeterminate. Elevated left  ventricular end-diastolic pressure.   Assessment and Plan: 1. Persistent afib:  Successful cardioversion x 2  but ERAF   She is rate controlled in afib today and feels great, she  did not see any change right after cardioversion until  now  For now will continue with amiodarone for rate control but reduce dose to 100 mg daily to offset potential SE's  of drug    She will continue  INR's, followed  In [place].  CHA2DS2VASc score of 4,  Coumadin clinic there made aware that amiodarone dose was reduced today for close f/u of INR's   Continue metoprolol at 12.5 mg bid and Cardizem at 120 mg bid   I will see back at one month for the effect of lowering amiodarone and then return her care to the [facility] cardiology office   F/u with A. Quinn/ Dr. Sherryle Lis as scheduled   Geroge Baseman. Celesta Funderburk, [facility] [address] [place], [place] [address] [phone]

## 2019-11-25 NOTE — Patient Instructions (Signed)
Decrease amiodarone to 100mg once a day  

## 2019-11-27 DIAGNOSIS — R911 Solitary pulmonary nodule: Secondary | ICD-10-CM | POA: Diagnosis not present

## 2019-11-27 DIAGNOSIS — Z87891 Personal history of nicotine dependence: Secondary | ICD-10-CM | POA: Diagnosis not present

## 2019-11-27 DIAGNOSIS — F17210 Nicotine dependence, cigarettes, uncomplicated: Secondary | ICD-10-CM | POA: Diagnosis not present

## 2019-12-01 DIAGNOSIS — I700 Atherosclerosis of aorta: Secondary | ICD-10-CM | POA: Diagnosis not present

## 2019-12-01 DIAGNOSIS — J90 Pleural effusion, not elsewhere classified: Secondary | ICD-10-CM | POA: Diagnosis not present

## 2019-12-01 DIAGNOSIS — I2510 Atherosclerotic heart disease of native coronary artery without angina pectoris: Secondary | ICD-10-CM | POA: Diagnosis not present

## 2019-12-01 DIAGNOSIS — J432 Centrilobular emphysema: Secondary | ICD-10-CM | POA: Diagnosis not present

## 2019-12-17 ENCOUNTER — Ambulatory Visit (INDEPENDENT_AMBULATORY_CARE_PROVIDER_SITE_OTHER): Payer: Medicare HMO | Admitting: *Deleted

## 2019-12-17 DIAGNOSIS — Z5181 Encounter for therapeutic drug level monitoring: Secondary | ICD-10-CM

## 2019-12-17 DIAGNOSIS — I4819 Other persistent atrial fibrillation: Secondary | ICD-10-CM

## 2019-12-17 LAB — POCT INR: INR: 1.6 — AB (ref 2.0–3.0)

## 2019-12-17 NOTE — Patient Instructions (Signed)
Amiodarone was decreased to 100mg  daily on 11/25/19 Increase warfarin to 1/2 tablet daily except 1 tablet on Sundays and Thursdays Recheck INR in 2 weeks

## 2019-12-22 DIAGNOSIS — E1129 Type 2 diabetes mellitus with other diabetic kidney complication: Secondary | ICD-10-CM | POA: Diagnosis not present

## 2019-12-22 DIAGNOSIS — E1122 Type 2 diabetes mellitus with diabetic chronic kidney disease: Secondary | ICD-10-CM | POA: Diagnosis not present

## 2019-12-22 DIAGNOSIS — I129 Hypertensive chronic kidney disease with stage 1 through stage 4 chronic kidney disease, or unspecified chronic kidney disease: Secondary | ICD-10-CM | POA: Diagnosis not present

## 2019-12-22 DIAGNOSIS — R809 Proteinuria, unspecified: Secondary | ICD-10-CM | POA: Diagnosis not present

## 2019-12-22 DIAGNOSIS — N185 Chronic kidney disease, stage 5: Secondary | ICD-10-CM | POA: Diagnosis not present

## 2019-12-22 DIAGNOSIS — E211 Secondary hyperparathyroidism, not elsewhere classified: Secondary | ICD-10-CM | POA: Diagnosis not present

## 2019-12-22 DIAGNOSIS — E872 Acidosis: Secondary | ICD-10-CM | POA: Diagnosis not present

## 2019-12-28 ENCOUNTER — Other Ambulatory Visit: Payer: Self-pay | Admitting: *Deleted

## 2019-12-28 ENCOUNTER — Ambulatory Visit (HOSPITAL_COMMUNITY)
Admission: RE | Admit: 2019-12-28 | Discharge: 2019-12-28 | Disposition: A | Discharge status: Home / Self Care | Payer: Medicare HMO | Source: Ambulatory Visit | Attending: Nurse Practitioner | Admitting: Nurse Practitioner

## 2019-12-28 ENCOUNTER — Encounter (HOSPITAL_COMMUNITY): Payer: Self-pay | Admitting: Nurse Practitioner

## 2019-12-28 ENCOUNTER — Other Ambulatory Visit: Payer: Self-pay

## 2019-12-28 VITALS — BP 140/88 | HR 91 | Ht 67.0 in | Wt 166.0 lb

## 2019-12-28 DIAGNOSIS — Z79899 Other long term (current) drug therapy: Secondary | ICD-10-CM | POA: Insufficient documentation

## 2019-12-28 DIAGNOSIS — I4891 Unspecified atrial fibrillation: Secondary | ICD-10-CM

## 2019-12-28 DIAGNOSIS — I4819 Other persistent atrial fibrillation: Secondary | ICD-10-CM | POA: Insufficient documentation

## 2019-12-28 DIAGNOSIS — D6869 Other thrombophilia: Secondary | ICD-10-CM | POA: Diagnosis not present

## 2019-12-28 DIAGNOSIS — Z794 Long term (current) use of insulin: Secondary | ICD-10-CM | POA: Diagnosis not present

## 2019-12-28 DIAGNOSIS — E1122 Type 2 diabetes mellitus with diabetic chronic kidney disease: Secondary | ICD-10-CM | POA: Diagnosis not present

## 2019-12-28 DIAGNOSIS — N186 End stage renal disease: Secondary | ICD-10-CM | POA: Diagnosis not present

## 2019-12-28 DIAGNOSIS — E785 Hyperlipidemia, unspecified: Secondary | ICD-10-CM | POA: Insufficient documentation

## 2019-12-28 DIAGNOSIS — Z7901 Long term (current) use of anticoagulants: Secondary | ICD-10-CM | POA: Diagnosis not present

## 2019-12-28 DIAGNOSIS — Z87891 Personal history of nicotine dependence: Secondary | ICD-10-CM | POA: Insufficient documentation

## 2019-12-28 DIAGNOSIS — Z992 Dependence on renal dialysis: Secondary | ICD-10-CM | POA: Diagnosis not present

## 2019-12-28 DIAGNOSIS — K219 Gastro-esophageal reflux disease without esophagitis: Secondary | ICD-10-CM | POA: Insufficient documentation

## 2019-12-28 DIAGNOSIS — I4510 Unspecified right bundle-branch block: Secondary | ICD-10-CM | POA: Insufficient documentation

## 2019-12-28 DIAGNOSIS — I120 Hypertensive chronic kidney disease with stage 5 chronic kidney disease or end stage renal disease: Secondary | ICD-10-CM | POA: Diagnosis not present

## 2019-12-28 MED ORDER — OMEPRAZOLE 20 MG PO CPDR: 20.0000 mg | DELAYED_RELEASE_CAPSULE | Freq: Every day | ORAL | 1 refills | Status: AC

## 2019-12-28 MED ORDER — AMIODARONE HCL 200 MG PO TABS: 200.0000 mg | ORAL_TABLET | Freq: Every day | ORAL | 6 refills | Status: AC

## 2019-12-28 NOTE — Progress Notes (Signed)
Primary Care Physician: Curlene Labrum, MD Referring Physician: Levell July NP Cardiologist: Dr. Johnny Bridge    Kerri Brown is a 74 y.o. female with a h/o PAF, ESRD that is being seen for persistent afib burden. She had a recent AV fistula placement for dialysis, initially in April and that is when the pt states since then her afib has persisted. She has h/o of onset of paroxysmal afib since 2017.  L fistula access placed  in anticipation of HD, she is on warfarin with a CHA2DS2VASc score of  at least 4. SHe was on DOAC prior to that and developed a blood clot in her rt brachial artery as she was cutting back on the drug due to price. BB was recently added and  increased to 100 mg bid and cardizem 120 mg was increased to 240 mg  daily added but pt has not picked up form the drugstore. She is rate controlled today. In afib she is very fatigued and dizzy and weak, She  states that she can not carry on like this. She did trip in the kitchen yesterday and hit her left ear on a drawer pull and bruised her rt flank.   F/u in afib clinic, 6/22. On last visit here we found her INR to be over 5. She  had not had her coumadin checked in some time. She was notified to hold her coumadin for several nights and is now seeing [name] in [place] for her INR and  2 days ago was 2.3. She is feeling much better now starting amiodarone and is rate controlled.   F/u 7/7. She had another fall a couple of days ago. She got her walker tangled up in a throw rug. She  was seen in the ED and dx with a rt foot sprain. She did not have LOC.  She has now had 4 therapeutic INR's and will now schedule  for a cardioversion as she has been loading on amiodarone approaching a month.  F/u in afib clinic, 7/22.  She had a successful cardioversion but unfortunately she is back in afib. She feels "great!" she was not aware that she was in afib today. No further falls. On amiodarone  200 mg daily.   F/u in afib clinc, 9/8. On last  visit, I discussed with pt trying one more cardioversion to see if we could get SR to "stick." She did have another cardioversion 8/31, which was successful but now has returned to rate controlled afib. She can not tell the difference in being in afib vrs SR. She still says she feels great! I feel the rate control of the amiodarone did help without dropping her BP and helped her overall if only for the rate control.   F/u in afib clinic 10/11. She has not felt as well with amiodarone at 100 mg daily. Her hr's are back in the 90's today and she feels it at times. On 200 mg amiodarone  daily she felt great and could not feel her heart rhythm.   Today, she denies symptoms of palpitations, chest pain, shortness of breath, orthopnea, PND, lower extremity edema, dizziness, presyncope, syncope, or neurologic sequela. The patient is tolerating medications without difficulties and is otherwise without complaint today.   Past Medical History:  Diagnosis Date  . Anemia   . Arthritis   . Bronchitis    hx  . CKD (chronic kidney disease)    Stage 5 due to DM  . Constipation  occasional  . Diabetes ([place])    type 2  . GERD (gastroesophageal reflux disease)   . Hyperlipemia   . Hypertension   . PAF (paroxysmal atrial fibrillation) ([place])   . Upper limb arterial embolus ([place]) 04/11/2019   RUE, s/p thromboembolectomy ([facility])   Past Surgical History:  Procedure Laterality Date  . AV FISTULA PLACEMENT Left 04/23/2019   Procedure: LEFT ARM ARTERIOVENOUS (AV) FISTULA CREATION;  Surgeon: Waynetta Sandy, MD;  Location: [facility];  Service: Vascular;  Laterality: Left;  . BASCILIC VEIN TRANSPOSITION Left 06/25/2019   Procedure: BASCILIC VEIN TRANSPOSITION LEFT ARM;  Surgeon: Waynetta Sandy, MD;  Location: [facility];  Service: Vascular;  Laterality: Left;  . BRACHIAL EMBOLECTOMY Right 04/11/2019  . CARDIOVERSION N/A 10/01/2019   Procedure: CARDIOVERSION;  Surgeon: Acie Fredrickson Wonda Cheng, MD;  Location: [facility]  ENDOSCOPY;  Service: Cardiovascular;  Laterality: N/A;  . CARDIOVERSION N/A 11/17/2019   Procedure: CARDIOVERSION;  Surgeon: Josue Hector, MD;  Location[facility] ENDOSCOPY;  Service: Cardiovascular;  Laterality: N/A;  . COLONOSCOPY    . KNEE ARTHROSCOPY Right   . LAPAROSCOPIC CHOLECYSTECTOMY    . TEE WITHOUT CARDIOVERSION N/A 11/17/2019   Procedure: TRANSESOPHAGEAL ECHOCARDIOGRAM (TEE);  Surgeon: Josue Hector, MD;  Location: [facility] ENDOSCOPY;  Service: Cardiovascular;  Laterality: N/A;  . TOTAL HIP ARTHROPLASTY Right     Current Outpatient Medications  Medication Sig Dispense Refill  . acetaminophen (TYLENOL) 500 MG tablet Take 500-1,000 mg by mouth as needed for moderate pain or headache.     . allopurinol (ZYLOPRIM) 100 MG tablet Take 100 mg by mouth at bedtime.     Marland Kitchen amiodarone (PACERONE) 200 MG tablet Take 1 tablet (200 mg total) by mouth daily. 30 tablet 6  . calcium acetate (PHOSLO) 667 MG capsule Take 667 mg by mouth 2 (two) times daily with a meal.     . cholecalciferol (VITAMIN D) 1000 units tablet Take 1,000 Units by mouth daily.    Marland Kitchen diltiazem (CARDIZEM) 120 MG tablet Take 120 mg by mouth in the morning and at bedtime.    . ferrous gluconate (FERGON) 240 (27 FE) MG tablet Take 240 mg by mouth daily.    . furosemide (LASIX) 40 MG tablet Take 40 mg by mouth 2 (two) times daily.     Marland Kitchen LANTUS SOLOSTAR 100 UNIT/ML Solostar Pen Inject 45 Units into the skin daily.     . metoprolol tartrate (LOPRESSOR) 25 MG tablet Take 0.5 tablets (12.5 mg total) by mouth 2 (two) times daily. 180 tablet 3  . omeprazole (PRILOSEC) 20 MG capsule Take 1 capsule (20 mg total) by mouth daily. 90 capsule 1  . rosuvastatin (CRESTOR) 10 MG tablet Take 10 mg by mouth once a week. Friday    . sodium bicarbonate 650 MG tablet Take 650 mg by mouth 3 (three) times daily.    Marland Kitchen warfarin (COUMADIN) 5 MG tablet Take 1 tablet (5 mg total) by mouth daily. (Patient taking differently: Take 2.5 mg by mouth every evening. ) 30  tablet 1   No current facility-administered medications for this encounter.    No Known Allergies  Social History   Socioeconomic History  . Marital status: Widowed    Spouse name: Not on file  . Number of children: Not on file  . Years of education: Not on file  . Highest education level: Not on file  Occupational History  . Not on file  Tobacco Use  . Smoking status: Former Smoker  Packs/day: 0.50    Years: 44.00    Pack years: 22.00    Types: Cigarettes    Start date: 05/04/1970    Quit date: 05/04/2014    Years since quitting: 5.6  . Smokeless tobacco: Never Used  Vaping Use  . Vaping Use: Never used  Substance and Sexual Activity  . Alcohol use: Never    Alcohol/week: 0.0 standard drinks  . Drug use: Never  . Sexual activity: Not on file  Other Topics Concern  . Not on file  Social History Narrative  . Not on file   Social Determinants of Health   Financial Resource Strain:   . Difficulty of Paying Living Expenses: Not on file  Food Insecurity:   . Worried About Charity fundraiser in the Last Year: Not on file  . Ran Out of Food in the Last Year: Not on file  Transportation Needs:   . Lack of Transportation (Medical): Not on file  . Lack of Transportation (Non-Medical): Not on file  Physical Activity:   . Days of Exercise per Week: Not on file  . Minutes of Exercise per Session: Not on file  Stress:   . Feeling of Stress : Not on file  Social Connections:   . Frequency of Communication with Friends and Family: Not on file  . Frequency of Social Gatherings with Friends and Family: Not on file  . Attends Religious Services: Not on file  . Active Member of Clubs or Organizations: Not on file  . Attends Archivist Meetings: Not on file  . Marital Status: Not on file  Intimate Partner Violence:   . Fear of Current or Ex-Partner: Not on file  . Emotionally Abused: Not on file  . Physically Abused: Not on file  . Sexually Abused: Not on file      Family History  Problem Relation Age of Onset  . Heart disease Father        had a pacemaker  . Diabetes Father   . Diabetes Sister   . Diabetes Sister   . Diabetes Other     ROS- All systems are reviewed and negative except as per the HPI above  Physical Exam: Vitals:   12/28/19 1329  BP: 140/88  Pulse: 91  Weight: 75.3 kg  Height: 5\' 7"  (1.702 m)   Wt Readings from Last 3 Encounters:  12/28/19 75.3 kg  11/25/19 73.3 kg  11/11/19 71.5 kg    Labs: Lab Results  Component Value Date   NA 140 11/11/2019   K 4.8 11/11/2019   CL 108 11/11/2019   CO2 19 (L) 11/11/2019   GLUCOSE 154 (H) 11/11/2019   BUN 56 (H) 11/11/2019   CREATININE 4.12 (H) 11/11/2019   CALCIUM 9.3 11/11/2019   Lab Results  Component Value Date   INR 1.6 (A) 12/17/2019   No results found for: CHOL, HDL, LDLCALC, TRIG   GEN- The patient is well appearing, alert and oriented x 3 today.   Head- normocephalic, atraumatic Eyes-  Sclera clear, conjunctiva pink Ears- hearing intact, bruising left ear  Oropharynx- clear Neck- supple, no JVP Lymph- no cervical lymphadenopathy Lungs- Clear to ausculation bilaterally, normal work of breathing Heart- irregular rate and rhythm, no murmurs, rubs or gallops, PMI not laterally displaced GI- soft, NT, ND, + BS, bruising rt flank  Extremities- no clubbing, cyanosis, or edema MS- no significant deformity or atrophy Skin- no rash or lesion Psych- euthymic mood, full affect Neuro- strength and sensation are  intact  EKG-afib at 70  bpm qrs int 128   ms, qtc 479  ms   Echo- 1. Left ventricular ejection fraction, by estimation, is 55 to 60%. The  left ventricle has normal function. The left ventricle has no regional  wall motion abnormalities. There is mild left ventricular hypertrophy of  the posterior segment. Left  ventricular diastolic parameters are indeterminate. Elevated left  ventricular end-diastolic pressure.  2. Right ventricular systolic  function is mildly reduced. The right  ventricular size is normal. There is normal pulmonary artery systolic  pressure.  3. Left atrial size was mildly dilated.  4. The mitral valve is grossly normal. Mild mitral valve regurgitation.  5. The aortic valve is tricuspid. Aortic valve regurgitation is not  visualized. No aortic stenosis is present.  6. The inferior vena cava is normal in size with greater than 50%  respiratory variability, suggesting right atrial pressure of 3 mmHg.   Comparison(s): Echocardiogram done 3/0/17 showed an EF of 54%.   Conclusion(s)/Recommendation(s): Patient was tachycardic throughout the  study. Consider contrast-enhancement at time of next echocardiogram for a  more accurate assessment of regional wall motion.   FINDINGS  Left Ventricle: Left ventricular ejection fraction, by estimation, is 55  to 60%. The left ventricle has normal function. The left ventricle has no  regional wall motion abnormalities. The left ventricular internal cavity  size was normal in size. There is  mild left ventricular hypertrophy of the posterior segment. Left  ventricular diastolic parameters are indeterminate. Elevated left  ventricular end-diastolic pressure.   Assessment and Plan: 1. Persistent afib:  Successful cardioversion x 2  but ERAF   She is rate controlled in afib today and feels great, she  did not see any change right after cardioversion until  now  Go back to 200 mg daily  amiodarone for rate control as she feels better on this dose .  She will continue  INR's, followed  In [place].  CHA2DS2VASc score of 4,  Coumadin clinic will be aware  that amiodarone dose was increased back today for close f/u of INR's   Continue metoprolol at 12.5 mg bid and Cardizem at 120 mg bid   F/u with A. Quinn/ Dr. Sherryle Lis as scheduled   Geroge Baseman. Azjah Pardo, [facility] [address] [place], [place] [address] [phone]

## 2019-12-28 NOTE — Patient Instructions (Signed)
Increase amiodarone to 200mg  once a day (1 tablet a day)

## 2019-12-31 DIAGNOSIS — E1122 Type 2 diabetes mellitus with diabetic chronic kidney disease: Secondary | ICD-10-CM | POA: Diagnosis not present

## 2019-12-31 DIAGNOSIS — E559 Vitamin D deficiency, unspecified: Secondary | ICD-10-CM | POA: Diagnosis not present

## 2019-12-31 DIAGNOSIS — R809 Proteinuria, unspecified: Secondary | ICD-10-CM | POA: Diagnosis not present

## 2019-12-31 DIAGNOSIS — N185 Chronic kidney disease, stage 5: Secondary | ICD-10-CM | POA: Diagnosis not present

## 2019-12-31 DIAGNOSIS — E211 Secondary hyperparathyroidism, not elsewhere classified: Secondary | ICD-10-CM | POA: Diagnosis not present

## 2019-12-31 DIAGNOSIS — E1129 Type 2 diabetes mellitus with other diabetic kidney complication: Secondary | ICD-10-CM | POA: Diagnosis not present

## 2020-01-04 ENCOUNTER — Ambulatory Visit (INDEPENDENT_AMBULATORY_CARE_PROVIDER_SITE_OTHER): Payer: Medicare HMO | Admitting: *Deleted

## 2020-01-04 DIAGNOSIS — I4819 Other persistent atrial fibrillation: Secondary | ICD-10-CM | POA: Diagnosis not present

## 2020-01-04 DIAGNOSIS — Z5181 Encounter for therapeutic drug level monitoring: Secondary | ICD-10-CM

## 2020-01-04 LAB — POCT INR: INR: 2.9 (ref 2.0–3.0)

## 2020-01-04 NOTE — Patient Instructions (Signed)
Amiodarone was increased to 200mg  daily on 12/28/19 Continue warfarin 1/2 tablet daily except 1 tablet on Sundays and Thursdays Recheck INR in 2 weeks

## 2020-01-18 ENCOUNTER — Ambulatory Visit (INDEPENDENT_AMBULATORY_CARE_PROVIDER_SITE_OTHER): Payer: Medicare HMO | Admitting: *Deleted

## 2020-01-18 DIAGNOSIS — Z5181 Encounter for therapeutic drug level monitoring: Secondary | ICD-10-CM | POA: Diagnosis not present

## 2020-01-18 DIAGNOSIS — I4819 Other persistent atrial fibrillation: Secondary | ICD-10-CM

## 2020-01-18 LAB — POCT INR: INR: 2.7 (ref 2.0–3.0)

## 2020-01-18 NOTE — Patient Instructions (Signed)
Amiodarone was increased to 200mg  daily on 12/28/19 Continue warfarin 1/2 tablet daily except 1 tablet on Sundays and Thursdays Recheck INR in 3 weeks

## 2020-02-08 ENCOUNTER — Ambulatory Visit (INDEPENDENT_AMBULATORY_CARE_PROVIDER_SITE_OTHER): Payer: Medicare HMO | Admitting: *Deleted

## 2020-02-08 DIAGNOSIS — Z5181 Encounter for therapeutic drug level monitoring: Secondary | ICD-10-CM | POA: Diagnosis not present

## 2020-02-08 DIAGNOSIS — I4819 Other persistent atrial fibrillation: Secondary | ICD-10-CM

## 2020-02-08 LAB — POCT INR: INR: 3.7 — AB (ref 2.0–3.0)

## 2020-02-08 NOTE — Patient Instructions (Signed)
Amiodarone was increased to 200mg  daily on 12/28/19 Hold warfarin tonight then decrease dose to 1/2 tablet daily except 1 tablet on Sundays  Recheck INR in 3 weeks

## 2020-02-09 DIAGNOSIS — I10 Essential (primary) hypertension: Secondary | ICD-10-CM | POA: Diagnosis not present

## 2020-02-09 DIAGNOSIS — N186 End stage renal disease: Secondary | ICD-10-CM | POA: Diagnosis not present

## 2020-02-09 DIAGNOSIS — E782 Mixed hyperlipidemia: Secondary | ICD-10-CM | POA: Diagnosis not present

## 2020-02-09 DIAGNOSIS — J439 Emphysema, unspecified: Secondary | ICD-10-CM | POA: Diagnosis not present

## 2020-02-09 DIAGNOSIS — E119 Type 2 diabetes mellitus without complications: Secondary | ICD-10-CM | POA: Diagnosis not present

## 2020-02-15 DIAGNOSIS — R809 Proteinuria, unspecified: Secondary | ICD-10-CM | POA: Diagnosis not present

## 2020-02-15 DIAGNOSIS — N185 Chronic kidney disease, stage 5: Secondary | ICD-10-CM | POA: Diagnosis not present

## 2020-02-15 DIAGNOSIS — I700 Atherosclerosis of aorta: Secondary | ICD-10-CM | POA: Diagnosis not present

## 2020-02-15 DIAGNOSIS — I10 Essential (primary) hypertension: Secondary | ICD-10-CM | POA: Diagnosis not present

## 2020-02-15 DIAGNOSIS — I4819 Other persistent atrial fibrillation: Secondary | ICD-10-CM | POA: Diagnosis not present

## 2020-02-15 DIAGNOSIS — E211 Secondary hyperparathyroidism, not elsewhere classified: Secondary | ICD-10-CM | POA: Diagnosis not present

## 2020-02-15 DIAGNOSIS — R911 Solitary pulmonary nodule: Secondary | ICD-10-CM | POA: Diagnosis not present

## 2020-02-15 DIAGNOSIS — E1122 Type 2 diabetes mellitus with diabetic chronic kidney disease: Secondary | ICD-10-CM | POA: Diagnosis not present

## 2020-02-15 DIAGNOSIS — J439 Emphysema, unspecified: Secondary | ICD-10-CM | POA: Diagnosis not present

## 2020-02-15 DIAGNOSIS — E782 Mixed hyperlipidemia: Secondary | ICD-10-CM | POA: Diagnosis not present

## 2020-02-15 DIAGNOSIS — E1129 Type 2 diabetes mellitus with other diabetic kidney complication: Secondary | ICD-10-CM | POA: Diagnosis not present

## 2020-02-15 DIAGNOSIS — N186 End stage renal disease: Secondary | ICD-10-CM | POA: Diagnosis not present

## 2020-02-15 DIAGNOSIS — E559 Vitamin D deficiency, unspecified: Secondary | ICD-10-CM | POA: Diagnosis not present

## 2020-02-19 DIAGNOSIS — E1122 Type 2 diabetes mellitus with diabetic chronic kidney disease: Secondary | ICD-10-CM | POA: Diagnosis not present

## 2020-02-19 DIAGNOSIS — R809 Proteinuria, unspecified: Secondary | ICD-10-CM | POA: Diagnosis not present

## 2020-02-19 DIAGNOSIS — E211 Secondary hyperparathyroidism, not elsewhere classified: Secondary | ICD-10-CM | POA: Diagnosis not present

## 2020-02-19 DIAGNOSIS — E559 Vitamin D deficiency, unspecified: Secondary | ICD-10-CM | POA: Diagnosis not present

## 2020-02-19 DIAGNOSIS — I129 Hypertensive chronic kidney disease with stage 1 through stage 4 chronic kidney disease, or unspecified chronic kidney disease: Secondary | ICD-10-CM | POA: Diagnosis not present

## 2020-02-19 DIAGNOSIS — N185 Chronic kidney disease, stage 5: Secondary | ICD-10-CM | POA: Diagnosis not present

## 2020-02-19 DIAGNOSIS — E1129 Type 2 diabetes mellitus with other diabetic kidney complication: Secondary | ICD-10-CM | POA: Diagnosis not present

## 2020-02-29 ENCOUNTER — Other Ambulatory Visit: Payer: Self-pay

## 2020-02-29 ENCOUNTER — Ambulatory Visit (INDEPENDENT_AMBULATORY_CARE_PROVIDER_SITE_OTHER): Payer: Medicare HMO | Admitting: *Deleted

## 2020-02-29 DIAGNOSIS — Z5181 Encounter for therapeutic drug level monitoring: Secondary | ICD-10-CM

## 2020-02-29 DIAGNOSIS — I4819 Other persistent atrial fibrillation: Secondary | ICD-10-CM | POA: Diagnosis not present

## 2020-02-29 LAB — POCT INR: INR: 4.2 — AB (ref 2.0–3.0)

## 2020-02-29 NOTE — Patient Instructions (Signed)
Amiodarone was increased to 200mg  daily on 12/28/19 Hold warfarin tonight then decrease dose to 1/2 tablet daily  Recheck INR in 2 weeks

## 2020-03-14 ENCOUNTER — Other Ambulatory Visit: Payer: Self-pay

## 2020-03-14 ENCOUNTER — Ambulatory Visit (INDEPENDENT_AMBULATORY_CARE_PROVIDER_SITE_OTHER): Payer: Medicare HMO | Admitting: *Deleted

## 2020-03-14 DIAGNOSIS — I4819 Other persistent atrial fibrillation: Secondary | ICD-10-CM

## 2020-03-14 DIAGNOSIS — Z5181 Encounter for therapeutic drug level monitoring: Secondary | ICD-10-CM

## 2020-03-14 LAB — POCT INR: INR: 2 (ref 2.0–3.0)

## 2020-03-14 NOTE — Patient Instructions (Signed)
Amiodarone was increased to 200mg  daily on 12/28/19 Take warfarin 1 tablet tonight then resume 1/2 tablet daily  Recheck INR in 4 weeks

## 2020-04-01 DIAGNOSIS — E1122 Type 2 diabetes mellitus with diabetic chronic kidney disease: Secondary | ICD-10-CM | POA: Diagnosis not present

## 2020-04-01 DIAGNOSIS — E211 Secondary hyperparathyroidism, not elsewhere classified: Secondary | ICD-10-CM | POA: Diagnosis not present

## 2020-04-01 DIAGNOSIS — E1129 Type 2 diabetes mellitus with other diabetic kidney complication: Secondary | ICD-10-CM | POA: Diagnosis not present

## 2020-04-01 DIAGNOSIS — N185 Chronic kidney disease, stage 5: Secondary | ICD-10-CM | POA: Diagnosis not present

## 2020-04-01 DIAGNOSIS — E559 Vitamin D deficiency, unspecified: Secondary | ICD-10-CM | POA: Diagnosis not present

## 2020-04-01 DIAGNOSIS — R809 Proteinuria, unspecified: Secondary | ICD-10-CM | POA: Diagnosis not present

## 2020-04-01 DIAGNOSIS — I129 Hypertensive chronic kidney disease with stage 1 through stage 4 chronic kidney disease, or unspecified chronic kidney disease: Secondary | ICD-10-CM | POA: Diagnosis not present

## 2020-04-12 ENCOUNTER — Other Ambulatory Visit: Payer: Self-pay

## 2020-04-19 DEATH — deceased

## 2020-04-25 ENCOUNTER — Ambulatory Visit: Payer: Medicare HMO | Admitting: Cardiology

## 2022-05-27 IMAGING — DX DG ANKLE COMPLETE 3+V*R*
3 series · 3 of 3 positions shown · non-contrast
Comparison: None.

CLINICAL DATA: Status post fall 13 days ago with right ankle pain.

EXAM:
RIGHT ANKLE - COMPLETE 3+ VIEW

[ankle ap]
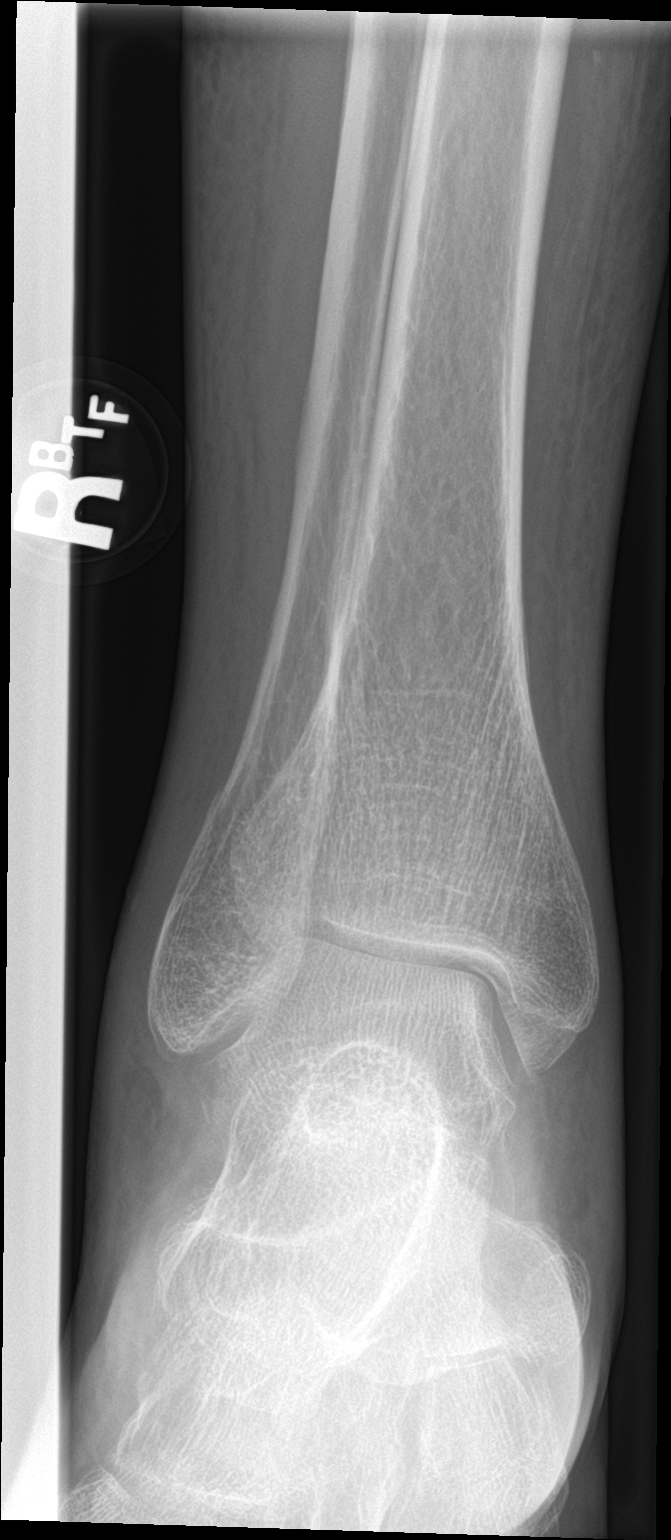

[ankle obl]
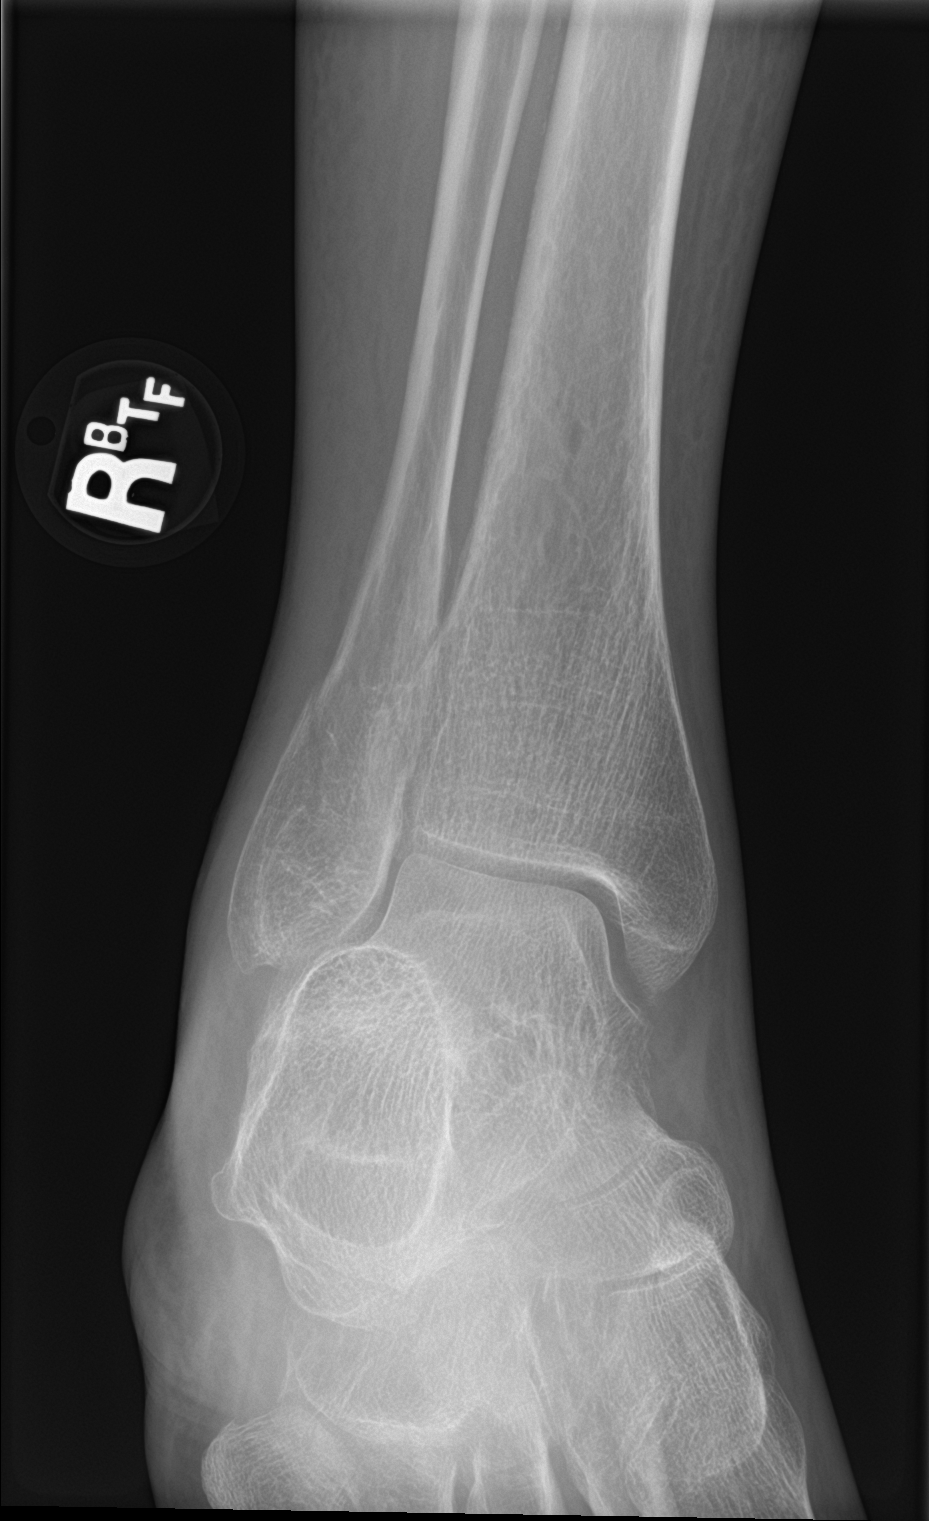

[ankle lat]
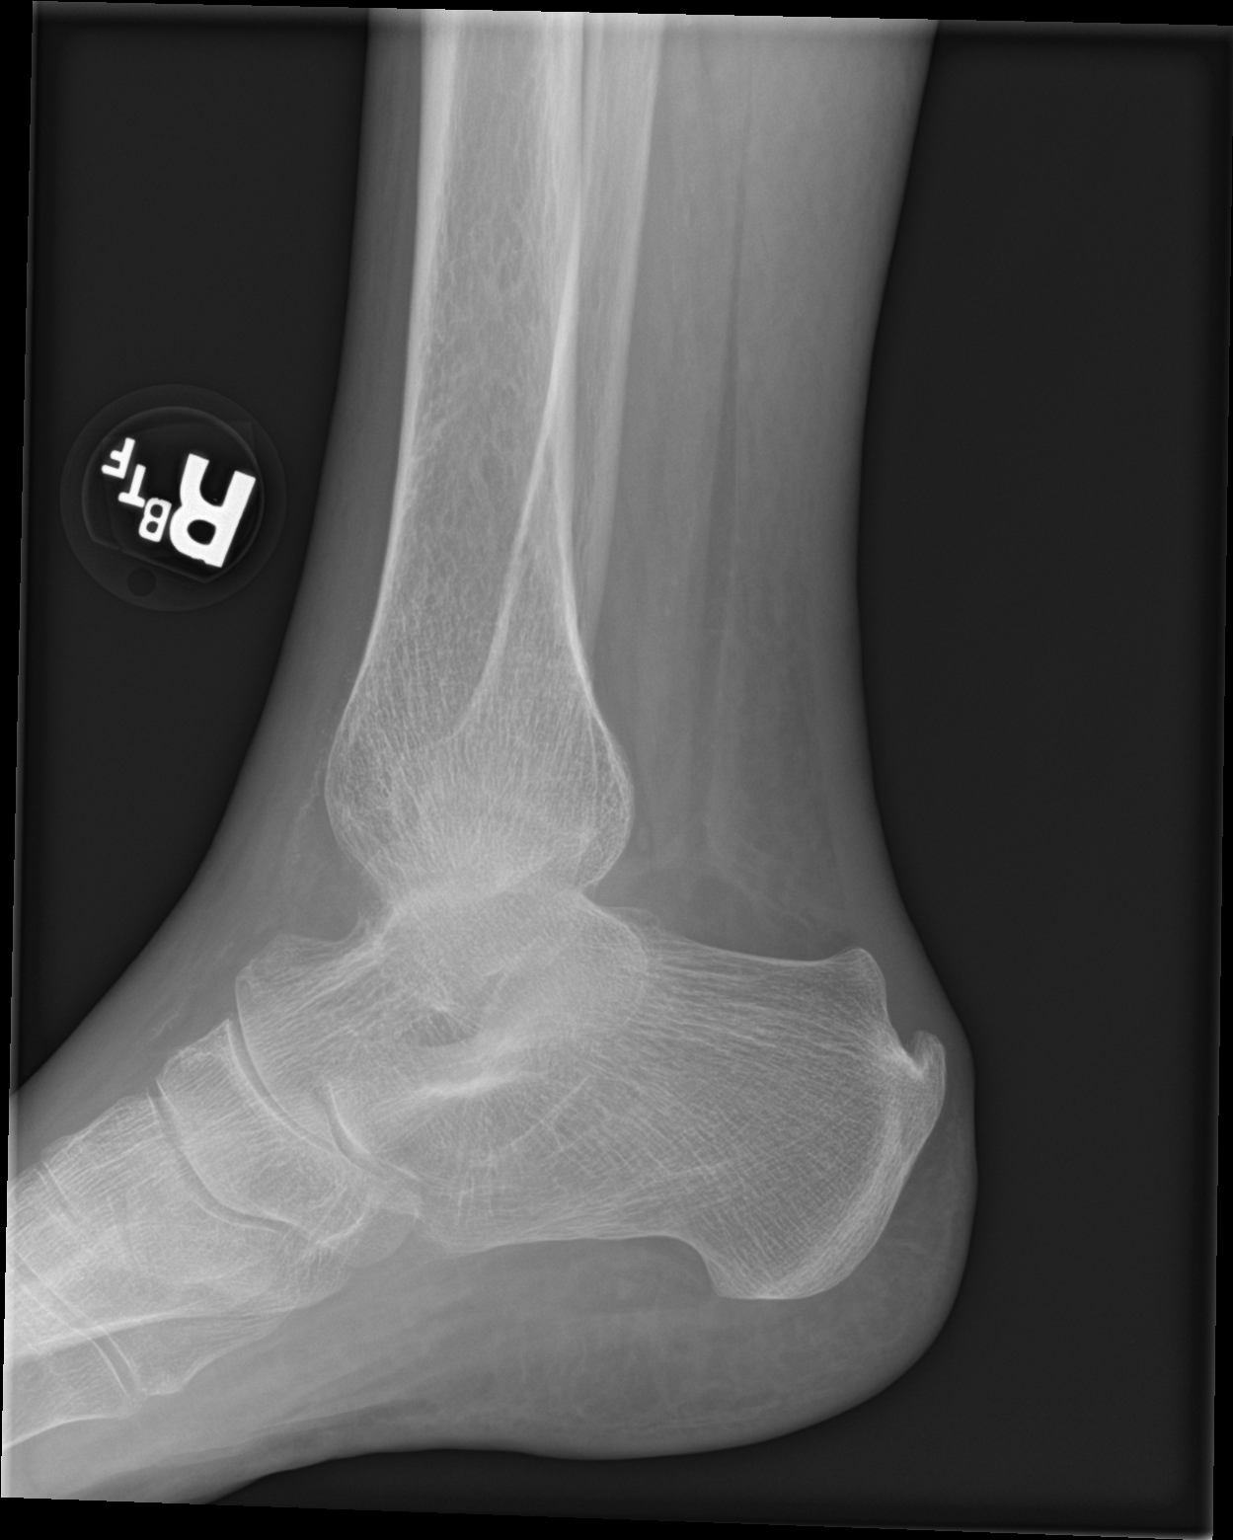

[3 of 3 positions shown; findings below may reference images not displayed]

FINDINGS: There is no evidence of fracture, dislocation, or joint effusion.
There is no evidence of arthropathy or other focal bone abnormality.
Mild soft tissue swelling is identified bilaterally.
IMPRESSION: No acute fracture or dislocation. Mild soft tissue swelling
bilaterally.

## 2022-05-27 IMAGING — DX DG FEMUR 2+V*R*
4 series · 4 of 4 positions shown · non-contrast
Comparison: None.

CLINICAL DATA: Status post fall 13 days ago with right hip pain.

EXAM:
RIGHT FEMUR 2 VIEWS

[femur ap (1 of 2)]
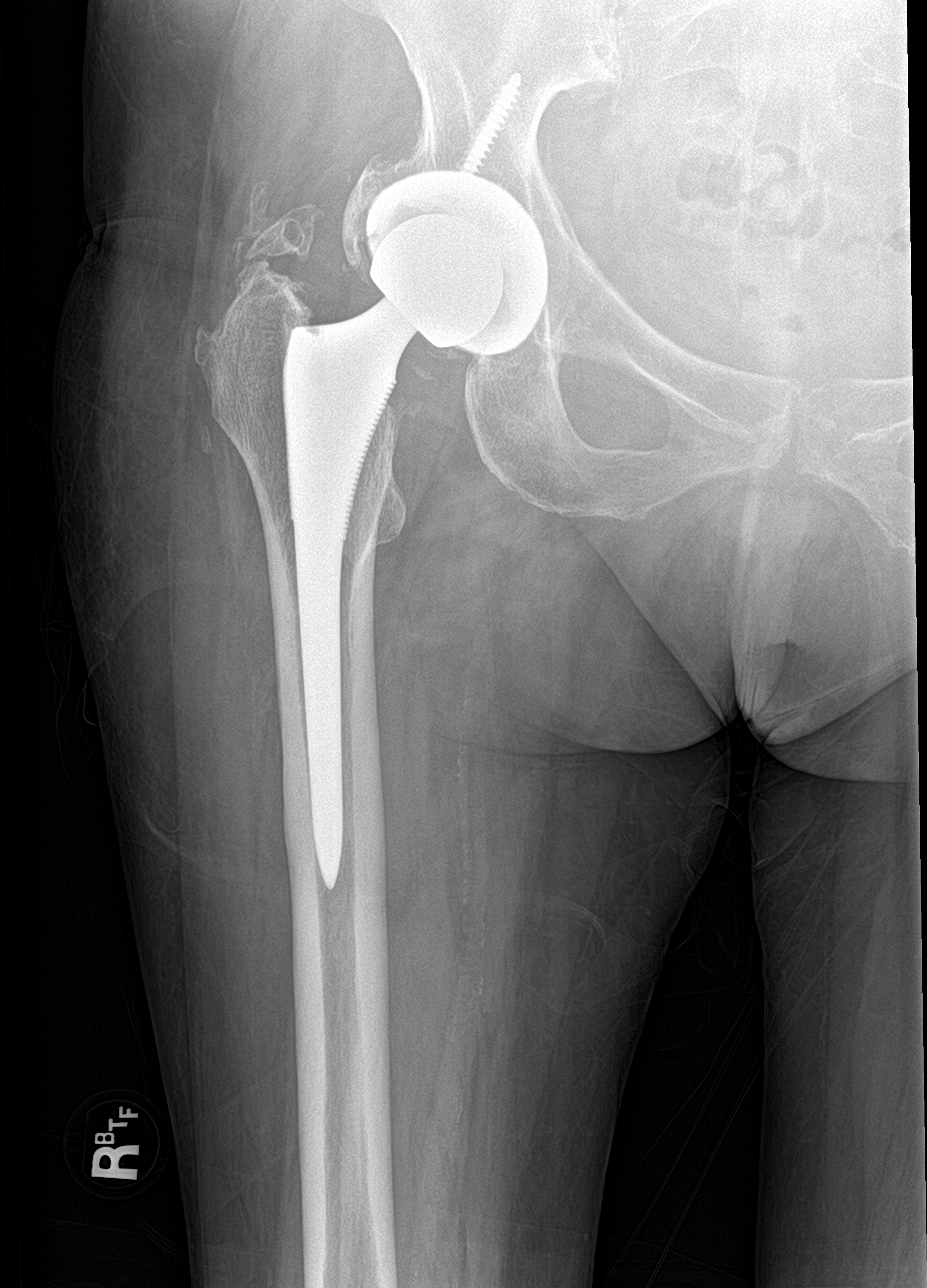

[femur ap (2 of 2)]
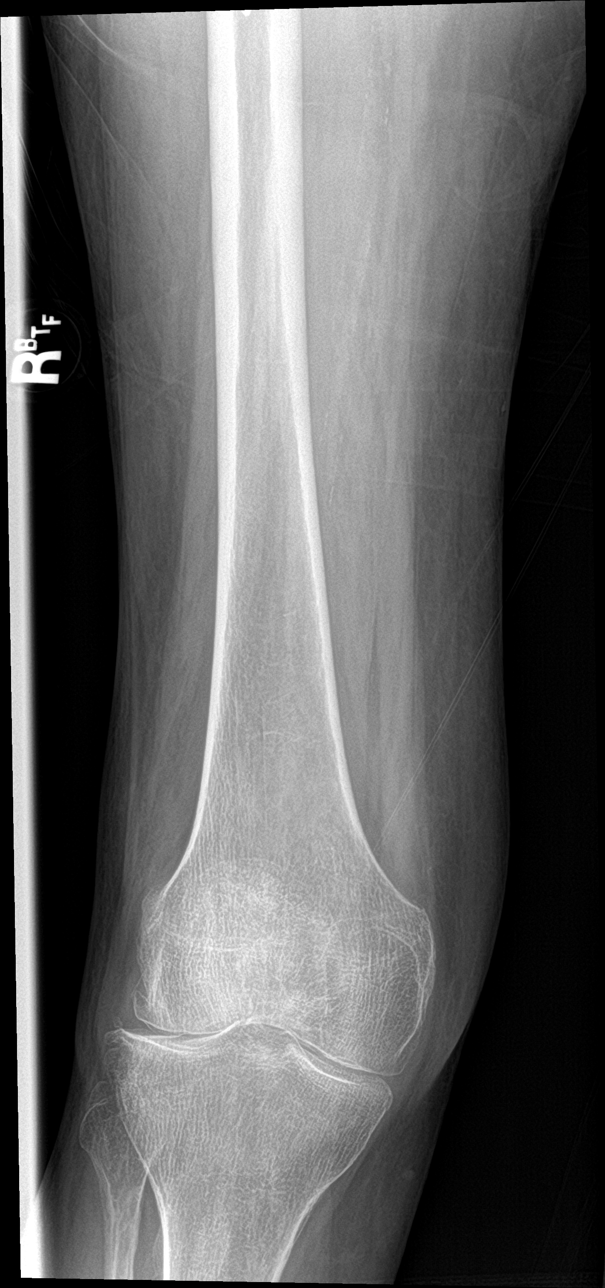

[femur lat (1 of 2)]
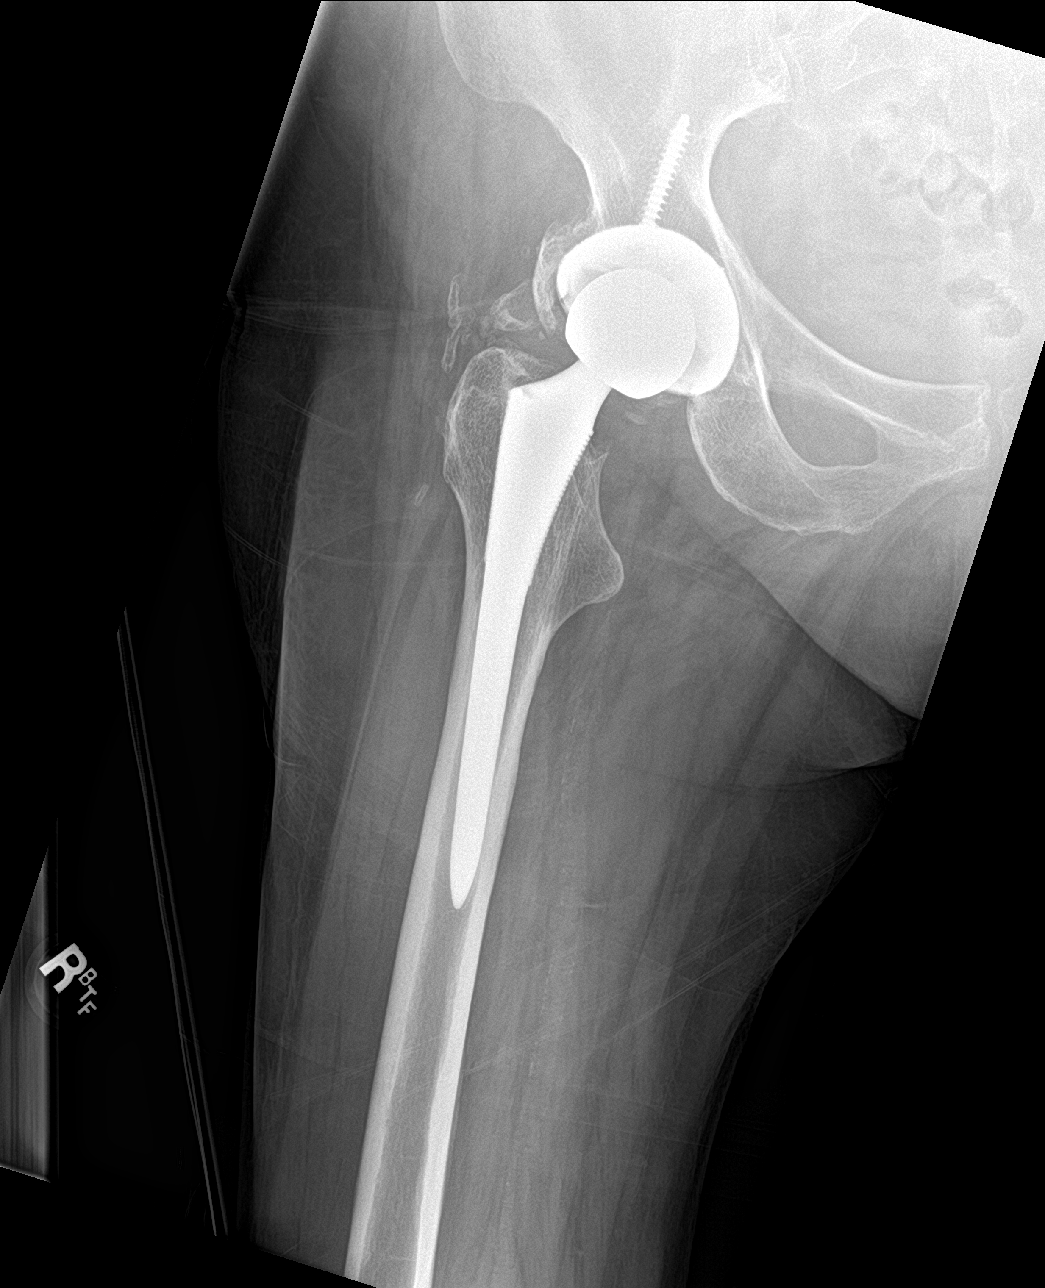

[femur lat (2 of 2)]
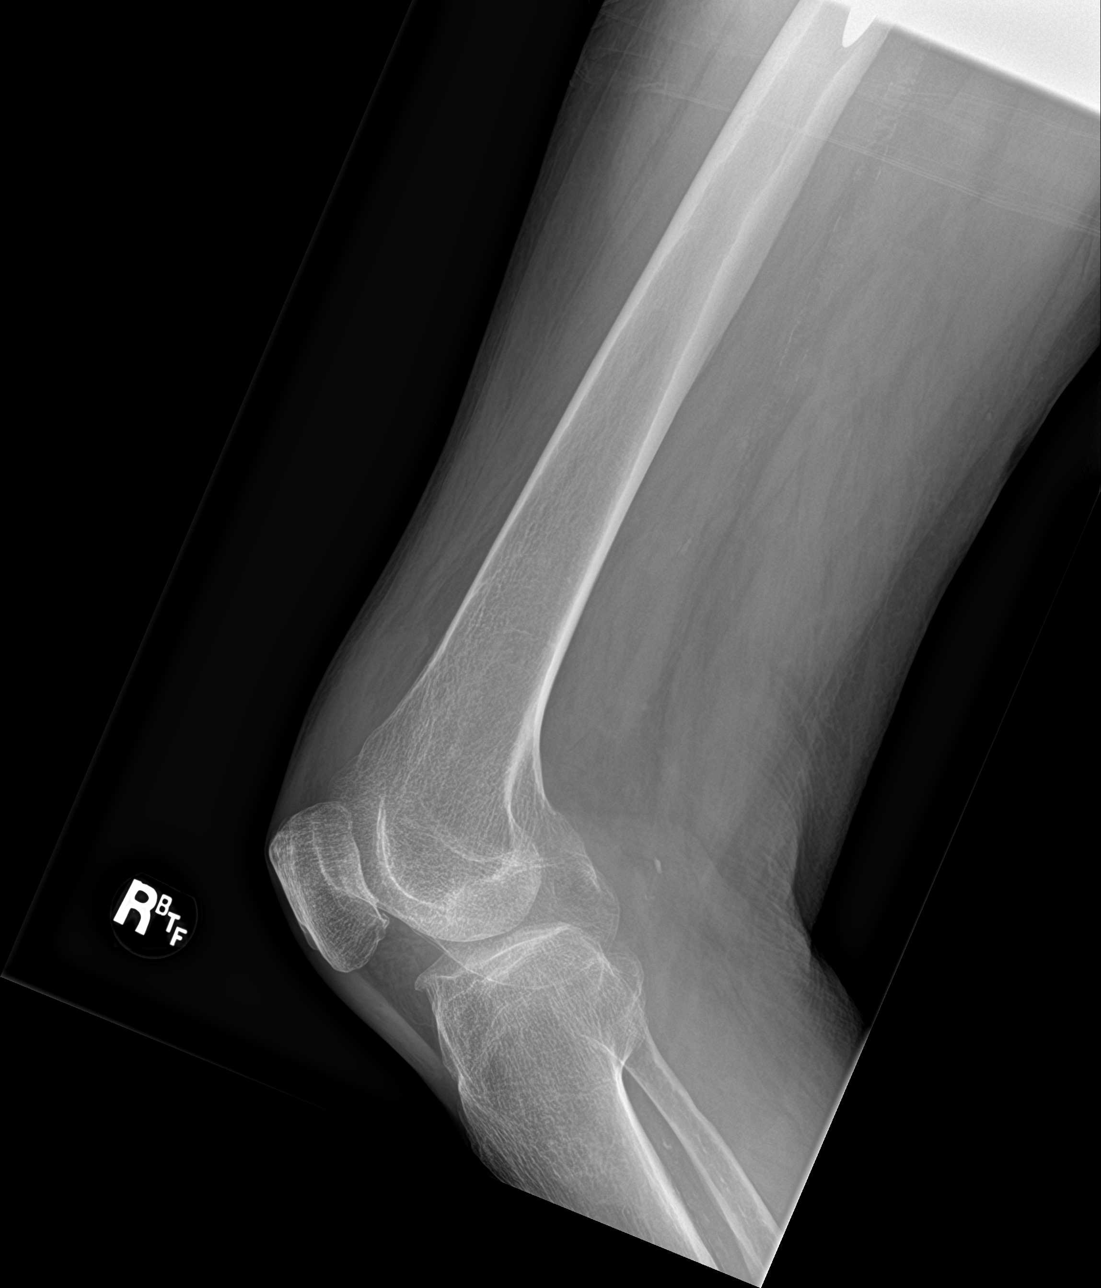

[4 of 4 positions shown; findings below may reference images not displayed]

FINDINGS: There is no evidence of fracture or dislocation. Right hip
replacement is identified without malalignment. Soft tissues are
unremarkable.
IMPRESSION: Right hip replacement without malalignment. No acute fracture or
dislocation.
